# Patient Record
Sex: Female | Born: 1985 | Race: White | Hispanic: No | Marital: Married | Smoking: Never smoker
Health system: Southern US, Community
[De-identification: ages and names within clinical notes are randomized; demographics above are authoritative.]

## PROBLEM LIST (undated history)

## (undated) ENCOUNTER — Inpatient Hospital Stay (HOSPITAL_COMMUNITY): Payer: Self-pay

## (undated) DIAGNOSIS — Z8619 Personal history of other infectious and parasitic diseases: Secondary | ICD-10-CM

## (undated) HISTORY — DX: Personal history of other infectious and parasitic diseases: Z86.19

---

## 2011-12-05 LAB — OB RESULTS CONSOLE GC/CHLAMYDIA: Chlamydia: NEGATIVE

## 2011-12-05 LAB — OB RESULTS CONSOLE RPR: RPR: NONREACTIVE

## 2011-12-05 LAB — OB RESULTS CONSOLE HEPATITIS B SURFACE ANTIGEN: Hepatitis B Surface Ag: NEGATIVE

## 2011-12-11 NOTE — L&D Delivery Note (Signed)
Delivery Note At 9:48 PM a viable and healthy female was delivered via Vaginal, Vacuum (Extractor) (Presentation: Left Occiput Anterior).  APGAR: 7, 8; weight 7 lb 13.2 oz (3549 g).  Moderate shoulder dystocia resolved with delivery of posterior (left) shoulder and arm. Placenta status: Intact, Spontaneous.  Cord: 3 vessels with the following complications: .  Cord pH: 7.22  Anesthesia: Epidural  Episiotomy: None Lacerations: 3rd degree perineal laceration Suture Repair: 3.0 chromic Est. Blood Loss (mL): 500  Mom to postpartum.  Baby to nursery-stable.  Kristin Le D 07/11/2012, 10:51 PM

## 2012-04-30 ENCOUNTER — Encounter: Payer: BC Managed Care – PPO | Attending: Obstetrics and Gynecology | Admitting: Dietician

## 2012-04-30 DIAGNOSIS — Z713 Dietary counseling and surveillance: Secondary | ICD-10-CM | POA: Insufficient documentation

## 2012-04-30 DIAGNOSIS — O99810 Abnormal glucose complicating pregnancy: Secondary | ICD-10-CM | POA: Insufficient documentation

## 2012-05-01 ENCOUNTER — Encounter: Payer: Self-pay | Admitting: Dietician

## 2012-05-01 NOTE — Progress Notes (Signed)
  Patient was seen on 04/30/2012 for Gestational Diabetes self-management class at the [facility]. The following learning objectives were met by the patient during this course:   States the definition of Gestational Diabetes  States why dietary management is important in controlling blood glucose  Describes the effects each nutrient has on blood glucose levels  Demonstrates ability to create a balanced meal plan  Demonstrates carbohydrate counting   States when to check blood glucose levels  Demonstrates proper blood glucose monitoring techniques  States the effect of stress and exercise on blood glucose levels  States the importance of limiting caffeine and abstaining from alcohol and smoking  Blood glucose monitor given: One Touch Ultra Mini Meter Kit Lot # [id number] X Exp: 10/2012 Blood glucose reading: 91 mg at 11:10 AM  Patient instructed to monitor glucose levels:Fasting and 2 hours after first bite of each meal FBS: 60 - <90 2 hour: <120  Patient received handouts:  Nutrition Diabetes and Pregnancy  Carbohydrate Counting List  Patient will be seen for follow-up as needed.

## 2012-07-08 ENCOUNTER — Telehealth (HOSPITAL_COMMUNITY): Payer: Self-pay | Admitting: *Deleted

## 2012-07-08 ENCOUNTER — Encounter (HOSPITAL_COMMUNITY): Payer: Self-pay | Admitting: *Deleted

## 2012-07-08 NOTE — Telephone Encounter (Signed)
Preadmission screen  

## 2012-07-10 ENCOUNTER — Inpatient Hospital Stay (HOSPITAL_COMMUNITY): Admission: RE | Admit: 2012-07-10 | Payer: BC Managed Care – PPO | Source: Ambulatory Visit

## 2012-07-10 ENCOUNTER — Encounter (HOSPITAL_COMMUNITY): Payer: Self-pay

## 2012-07-10 ENCOUNTER — Inpatient Hospital Stay (HOSPITAL_COMMUNITY)
Admission: RE | Admit: 2012-07-10 | Discharge: 2012-07-13 | DRG: 372 | Disposition: A | Discharge status: Home / Self Care | Payer: BC Managed Care – PPO | Source: Ambulatory Visit | Attending: Obstetrics and Gynecology | Admitting: Obstetrics and Gynecology

## 2012-07-10 DIAGNOSIS — Z3400 Encounter for supervision of normal first pregnancy, unspecified trimester: Secondary | ICD-10-CM

## 2012-07-10 DIAGNOSIS — O99814 Abnormal glucose complicating childbirth: Secondary | ICD-10-CM | POA: Diagnosis present

## 2012-07-10 LAB — CBC
HCT: 37 % (ref 36.0–46.0)
MCH: 32.2 pg (ref 26.0–34.0)
MCHC: 33.8 g/dL (ref 30.0–36.0)
MCV: 95.4 fL (ref 78.0–100.0)
RDW: 14.7 % (ref 11.5–15.5)

## 2012-07-10 MED ORDER — OXYCODONE-ACETAMINOPHEN 5-325 MG PO TABS
1.0000 | ORAL_TABLET | ORAL | Status: DC | PRN
  Filled 2012-07-10: qty 2

## 2012-07-10 MED ORDER — OXYTOCIN BOLUS FROM INFUSION
250.0000 mL | Freq: Once | INTRAVENOUS | Status: DC
  Filled 2012-07-10: qty 500

## 2012-07-10 MED ORDER — MISOPROSTOL 25 MCG QUARTER TABLET
25.0000 ug | ORAL_TABLET | ORAL | Status: DC | PRN
  Administered 2012-07-10 – 2012-07-11 (×2): 25 ug via VAGINAL
  Filled 2012-07-10 (×2): qty 0.25

## 2012-07-10 MED ORDER — IBUPROFEN 600 MG PO TABS: 600.0000 mg | ORAL_TABLET | Freq: Four times a day (QID) | ORAL | Status: DC | PRN

## 2012-07-10 MED ORDER — ACETAMINOPHEN 325 MG PO TABS
650.0000 mg | ORAL_TABLET | ORAL | Status: DC | PRN
  Administered 2012-07-11: 650 mg via ORAL
  Filled 2012-07-10: qty 2

## 2012-07-10 MED ORDER — ONDANSETRON HCL 4 MG/2ML IJ SOLN: 4.0000 mg | Freq: Four times a day (QID) | INTRAMUSCULAR | Status: DC | PRN

## 2012-07-10 MED ORDER — ZOLPIDEM TARTRATE 5 MG PO TABS: 5.0000 mg | ORAL_TABLET | Freq: Every evening | ORAL | Status: DC | PRN

## 2012-07-10 MED ORDER — TERBUTALINE SULFATE 1 MG/ML IJ SOLN
0.2500 mg | Freq: Once | INTRAMUSCULAR | Status: AC | PRN
  Filled 2012-07-10: qty 1

## 2012-07-10 MED ORDER — LACTATED RINGERS IV SOLN
500.0000 mL | INTRAVENOUS | Status: DC | PRN
  Administered 2012-07-11: 500 mL via INTRAVENOUS

## 2012-07-10 MED ORDER — FLEET ENEMA 7-19 GM/118ML RE ENEM: 1.0000 | ENEMA | RECTAL | Status: DC | PRN

## 2012-07-10 MED ORDER — OXYTOCIN 40 UNITS IN LACTATED RINGERS INFUSION - SIMPLE MED: 62.5000 mL/h | Freq: Once | INTRAVENOUS | Status: DC

## 2012-07-10 MED ORDER — LACTATED RINGERS IV SOLN
INTRAVENOUS | Status: DC
  Administered 2012-07-10 – 2012-07-11 (×4): via INTRAVENOUS

## 2012-07-10 MED ORDER — LIDOCAINE HCL (PF) 1 % IJ SOLN
30.0000 mL | INTRAMUSCULAR | Status: DC | PRN
  Administered 2012-07-11: 30 mL via SUBCUTANEOUS
  Filled 2012-07-10: qty 30

## 2012-07-10 MED ORDER — CITRIC ACID-SODIUM CITRATE 334-500 MG/5ML PO SOLN: 30.0000 mL | ORAL | Status: DC | PRN

## 2012-07-10 NOTE — H&P (Signed)
26 y.o. G1P0  Estimated Date of Delivery: 07/11/12 admitted at [redacted] weeks gestation for induction.  Prenatal Transfer Tool  Maternal Diabetes: Gestational DM well controlled on Glyburide. Genetic Screening: Normal Maternal Ultrasounds/Referrals: Normal Fetal Ultrasounds or other Referrals:  None Maternal Substance Abuse:  No Significant Maternal Medications:  Glyburide 5 mg in AM and 2.5 mg in PM Significant Maternal Lab Results: Abnormal 3 hour GTT Other Significant Pregnancy Complications:  None  Afebrile, VSS Heart and Lungs: No active disease Abdomen: soft, gravid, EFW AGA. Cervical exam:  Closed  Impression: Well controlled Gestational DM on Glyburide.  Plan:  Induction.

## 2012-07-11 ENCOUNTER — Encounter (HOSPITAL_COMMUNITY): Payer: Self-pay

## 2012-07-11 ENCOUNTER — Inpatient Hospital Stay (HOSPITAL_COMMUNITY): Payer: BC Managed Care – PPO | Admitting: Anesthesiology

## 2012-07-11 ENCOUNTER — Encounter (HOSPITAL_COMMUNITY): Payer: Self-pay | Admitting: Anesthesiology

## 2012-07-11 MED ORDER — TERBUTALINE SULFATE 1 MG/ML IJ SOLN
0.2500 mg | Freq: Once | INTRAMUSCULAR | Status: AC
  Administered 2012-07-11: 0.25 mg via SUBCUTANEOUS

## 2012-07-11 MED ORDER — OXYTOCIN 40 UNITS IN LACTATED RINGERS INFUSION - SIMPLE MED
1.0000 m[IU]/min | INTRAVENOUS | Status: DC
  Filled 2012-07-11: qty 1000

## 2012-07-11 MED ORDER — EPHEDRINE 5 MG/ML INJ: 10.0000 mg | INTRAVENOUS | Status: DC | PRN

## 2012-07-11 MED ORDER — NALBUPHINE SYRINGE 5 MG/0.5 ML
5.0000 mg | INJECTION | INTRAMUSCULAR | Status: DC | PRN
  Administered 2012-07-11: 5 mg via INTRAVENOUS
  Filled 2012-07-11 (×2): qty 0.5

## 2012-07-11 MED ORDER — FENTANYL 2.5 MCG/ML BUPIVACAINE 1/10 % EPIDURAL INFUSION (WH - ANES)
INTRAMUSCULAR | Status: DC | PRN
  Administered 2012-07-11: 14 mL/h via EPIDURAL

## 2012-07-11 MED ORDER — MISOPROSTOL 200 MCG PO TABS
ORAL_TABLET | ORAL | Status: AC
  Filled 2012-07-11: qty 4

## 2012-07-11 MED ORDER — SODIUM BICARBONATE 8.4 % IV SOLN
INTRAVENOUS | Status: DC | PRN
  Administered 2012-07-11: 4 mL via EPIDURAL

## 2012-07-11 MED ORDER — EPHEDRINE 5 MG/ML INJ
10.0000 mg | INTRAVENOUS | Status: DC | PRN
  Filled 2012-07-11 (×3): qty 4

## 2012-07-11 MED ORDER — OXYCODONE-ACETAMINOPHEN 5-325 MG PO TABS
2.0000 | ORAL_TABLET | ORAL | Status: DC | PRN
  Administered 2012-07-11: 2 via ORAL
  Administered 2012-07-12 – 2012-07-13 (×3): 1 via ORAL
  Filled 2012-07-11: qty 2
  Filled 2012-07-11 (×3): qty 1

## 2012-07-11 MED ORDER — TERBUTALINE SULFATE 1 MG/ML IJ SOLN: 0.2500 mg | Freq: Once | INTRAMUSCULAR | Status: AC | PRN

## 2012-07-11 MED ORDER — OXYTOCIN 40 UNITS IN LACTATED RINGERS INFUSION - SIMPLE MED: 1.0000 m[IU]/min | INTRAVENOUS | Status: DC

## 2012-07-11 MED ORDER — FENTANYL 2.5 MCG/ML BUPIVACAINE 1/10 % EPIDURAL INFUSION (WH - ANES)
14.0000 mL/h | INTRAMUSCULAR | Status: DC
  Administered 2012-07-11 (×6): 14 mL/h via EPIDURAL
  Filled 2012-07-11 (×7): qty 60

## 2012-07-11 MED ORDER — PHENYLEPHRINE 40 MCG/ML (10ML) SYRINGE FOR IV PUSH (FOR BLOOD PRESSURE SUPPORT): 80.0000 ug | PREFILLED_SYRINGE | INTRAVENOUS | Status: DC | PRN

## 2012-07-11 MED ORDER — OXYTOCIN 40 UNITS IN LACTATED RINGERS INFUSION - SIMPLE MED
1.0000 m[IU]/min | INTRAVENOUS | Status: DC
  Administered 2012-07-11: 2 m[IU]/min via INTRAVENOUS

## 2012-07-11 MED ORDER — PHENYLEPHRINE 40 MCG/ML (10ML) SYRINGE FOR IV PUSH (FOR BLOOD PRESSURE SUPPORT)
80.0000 ug | PREFILLED_SYRINGE | INTRAVENOUS | Status: DC | PRN
  Filled 2012-07-11 (×4): qty 5

## 2012-07-11 MED ORDER — DIPHENHYDRAMINE HCL 50 MG/ML IJ SOLN: 12.5000 mg | INTRAMUSCULAR | Status: DC | PRN

## 2012-07-11 MED ORDER — LACTATED RINGERS IV SOLN
500.0000 mL | Freq: Once | INTRAVENOUS | Status: AC
  Administered 2012-07-11: 500 mL via INTRAVENOUS

## 2012-07-11 MED ORDER — OXYCODONE-ACETAMINOPHEN 5-325 MG PO TABS: 1.0000 | ORAL_TABLET | ORAL | Status: DC | PRN

## 2012-07-11 MED ORDER — BUPIVACAINE HCL (PF) 0.25 % IJ SOLN
INTRAMUSCULAR | Status: DC | PRN
  Administered 2012-07-11 (×9): 5 mL

## 2012-07-11 NOTE — Anesthesia Procedure Notes (Addendum)
Epidural Patient location during procedure: OB  Preanesthetic Checklist Completed: patient identified, site marked, surgical consent, pre-op evaluation, timeout performed, IV checked, risks and benefits discussed and monitors and equipment checked  Epidural Patient position: sitting Prep: site prepped and draped and DuraPrep Patient monitoring: continuous pulse ox and blood pressure Approach: midline Injection technique: LOR air  Needle:  Needle type: Tuohy  Needle gauge: 17 G Needle length: 9 cm Needle insertion depth: 5 cm cm Catheter type: closed end flexible Catheter size: 19 Gauge Catheter at skin depth: 10 cm Test dose: negative  Assessment Events: blood not aspirated, injection not painful, no injection resistance, negative IV test and no paresthesia  Additional Notes Dosing of Epidural:  1st dose, through needle ............................................Marland Kitchen epi 1:200K + Xylocaine 40 mg  2nd dose, through catheter, after waiting 3 minutes...Marland KitchenMarland Kitchenepi 1:200K + Xylocaine 40 mg  3rd dose, through catheter after waiting 3 minutes .............................Marcaine   4mg    ( mg Marcaine are expressed as equivilent  cc's medication removed from the 0.1%Bupiv / fentanyl syringe from L&D pump)  ( 2% Xylo charted as a single dose in Epic Meds for ease of charting; actual dosing was fractionated as above, for saftey's sake)  As each dose occurred, patient was free of IV sx; and patient exhibited no evidence of SA injection.  Patient is more comfortable after epidural dosed. Please see RN's note for documentation of vital signs,and FHR which are stable.  Patient reminded not to try to ambulate with numb legs, and that an RN must be present the 1st time she attempts to get up.   Epidural Patient location during procedure: OB Start time: 07/11/2012 12:04 PM  Staffing Anesthesiologist: Brayton Caves R Performed by: anesthesiologist   Preanesthetic Checklist Completed:  patient identified, site marked, surgical consent, pre-op evaluation, timeout performed, IV checked, risks and benefits discussed and monitors and equipment checked  Epidural Patient position: sitting Prep: site prepped and draped and DuraPrep Patient monitoring: continuous pulse ox and blood pressure Approach: midline Injection technique: LOR air and LOR saline  Needle:  Needle type: Tuohy  Needle gauge: 17 G Needle length: 9 cm Needle insertion depth: 5 cm cm Catheter type: closed end flexible Catheter size: 19 Gauge Catheter at skin depth: 10 cm Test dose: negative  Assessment Events: blood not aspirated, injection not painful, no injection resistance, negative IV test and no paresthesia  Additional Notes Patient identified.  Risk benefits discussed including failed block, incomplete pain control, headache, nerve damage, paralysis, blood pressure changes, nausea, vomiting, reactions to medication both toxic or allergic, and postpartum back pain.  Patient expressed understanding and wished to proceed.  All questions were answered.  Sterile technique used throughout procedure and epidural site dressed with sterile barrier dressing. No paresthesia or other complications noted.The patient did not experience any signs of intravascular injection such as tinnitus or metallic taste in mouth nor signs of intrathecal spread such as rapid motor block. Please see nursing notes for vital signs.

## 2012-07-11 NOTE — Anesthesia Preprocedure Evaluation (Signed)
Anesthesia Evaluation  Patient identified by MRN, date of birth, ID band Patient awake    Reviewed: Allergy & Precautions, H&P , Patient's Chart, lab work & pertinent test results  Airway Mallampati: II TM Distance: >3 FB Neck ROM: full    Dental  (+) Teeth Intact   Pulmonary  breath sounds clear to auscultation        Cardiovascular Rhythm:regular Rate:Normal     Neuro/Psych    GI/Hepatic   Endo/Other  Gestational  Renal/GU      Musculoskeletal   Abdominal   Peds  Hematology   Anesthesia Other Findings       Reproductive/Obstetrics (+) Pregnancy                           Anesthesia Physical Anesthesia Plan  ASA: II  Anesthesia Plan: Epidural   Post-op Pain Management:    Induction:   Airway Management Planned:   Additional Equipment:   Intra-op Plan:   Post-operative Plan:   Informed Consent: I have reviewed the patients History and Physical, chart, labs and discussed the procedure including the risks, benefits and alternatives for the proposed anesthesia with the patient or authorized representative who has indicated his/her understanding and acceptance.   Dental Advisory Given  Plan Discussed with:   Anesthesia Plan Comments: (Labs checked- platelets confirmed with RN in room. Fetal heart tracing, per RN, reported to be stable enough for sitting procedure. Discussed epidural, and patient consents to the procedure:  included risk of possible headache,backache, failed block, allergic reaction, and nerve injury. This patient was asked if she had any questions or concerns before the procedure started. )        Anesthesia Quick Evaluation

## 2012-07-12 ENCOUNTER — Encounter (HOSPITAL_COMMUNITY): Payer: Self-pay

## 2012-07-12 LAB — CBC
Hemoglobin: 10.4 g/dL — ABNORMAL LOW (ref 12.0–15.0)
MCH: 32 pg (ref 26.0–34.0)
Platelets: 135 10*3/uL — ABNORMAL LOW (ref 150–400)
RBC: 3.25 MIL/uL — ABNORMAL LOW (ref 3.87–5.11)
WBC: 19.9 10*3/uL — ABNORMAL HIGH (ref 4.0–10.5)

## 2012-07-12 MED ORDER — TETANUS-DIPHTH-ACELL PERTUSSIS 5-2.5-18.5 LF-MCG/0.5 IM SUSP: 0.5000 mL | Freq: Once | INTRAMUSCULAR | Status: DC

## 2012-07-12 MED ORDER — DIPHENHYDRAMINE HCL 25 MG PO CAPS: 25.0000 mg | ORAL_CAPSULE | Freq: Four times a day (QID) | ORAL | Status: DC | PRN

## 2012-07-12 MED ORDER — ZOLPIDEM TARTRATE 5 MG PO TABS: 5.0000 mg | ORAL_TABLET | Freq: Every evening | ORAL | Status: DC | PRN

## 2012-07-12 MED ORDER — SENNOSIDES-DOCUSATE SODIUM 8.6-50 MG PO TABS
2.0000 | ORAL_TABLET | Freq: Every day | ORAL | Status: DC
  Administered 2012-07-13: 2 via ORAL

## 2012-07-12 MED ORDER — DIBUCAINE 1 % RE OINT
1.0000 "application " | TOPICAL_OINTMENT | RECTAL | Status: DC | PRN
  Administered 2012-07-12: 1 via RECTAL
  Filled 2012-07-12: qty 28

## 2012-07-12 MED ORDER — WITCH HAZEL-GLYCERIN EX PADS
1.0000 "application " | MEDICATED_PAD | CUTANEOUS | Status: DC | PRN
  Administered 2012-07-12: 1 via TOPICAL

## 2012-07-12 MED ORDER — ONDANSETRON HCL 4 MG/2ML IJ SOLN: 4.0000 mg | INTRAMUSCULAR | Status: DC | PRN

## 2012-07-12 MED ORDER — BENZOCAINE-MENTHOL 20-0.5 % EX AERO
1.0000 "application " | INHALATION_SPRAY | CUTANEOUS | Status: DC | PRN
  Administered 2012-07-12: 1 via TOPICAL
  Filled 2012-07-12: qty 56

## 2012-07-12 MED ORDER — IBUPROFEN 600 MG PO TABS
600.0000 mg | ORAL_TABLET | Freq: Four times a day (QID) | ORAL | Status: DC
  Administered 2012-07-12 – 2012-07-13 (×7): 600 mg via ORAL
  Filled 2012-07-12 (×7): qty 1

## 2012-07-12 MED ORDER — PRENATAL MULTIVITAMIN CH
1.0000 | ORAL_TABLET | Freq: Every day | ORAL | Status: DC
  Administered 2012-07-12 – 2012-07-13 (×2): 1 via ORAL
  Filled 2012-07-12 (×2): qty 1

## 2012-07-12 MED ORDER — ONDANSETRON HCL 4 MG PO TABS: 4.0000 mg | ORAL_TABLET | ORAL | Status: DC | PRN

## 2012-07-12 MED ORDER — SIMETHICONE 80 MG PO CHEW: 80.0000 mg | CHEWABLE_TABLET | ORAL | Status: DC | PRN

## 2012-07-12 MED ORDER — LANOLIN HYDROUS EX OINT: TOPICAL_OINTMENT | CUTANEOUS | Status: DC | PRN

## 2012-07-12 NOTE — Progress Notes (Signed)
Post Partum Day 1 Subjective: no complaints  Objective: Blood pressure 97/62, pulse 93, temperature 98.3 F (36.8 C), breastfeeding.  Physical Exam:  General: alert Lochia: appropriate Uterine Fundus: firm   Basename 07/12/12 0515 07/10/12 2027  HGB 10.4* 12.5  HCT 30.6* 37.0    Assessment/Plan: Plan for discharge tomorrow   LOS: 2 days   Kristin Le 07/12/2012, 8:43 AM

## 2012-07-12 NOTE — Progress Notes (Signed)
Called Dr. Arlyce Dice because patient is extremely unhappy with her progress in pushing, even though progress is good, baby at +2/+3 station. Pt crying, saying she cannot take this anymore, can't stand the pressure in her bottom, denies painful contractions but a bit out of control. Husband saying we must do something to deliver baby. Told them I will relay their concerns to Dr. Arlyce Dice. Per Dr. Arlyce Dice he will come in and see if he can assist patient.

## 2012-07-12 NOTE — Progress Notes (Signed)
Husband continues to be upset about patients pain during this experience. Complained that patient "felt every stitch" during repair.

## 2012-07-12 NOTE — Progress Notes (Signed)
Dr. Arlyce Dice at bedside, SVE, explained to patient that he can assist her pushing with vacuum to deliver baby. Patient agreed that would be best. IUPC removed, foley cath. Removed. Patient set up for delivery. Husband at bedside.

## 2012-07-12 NOTE — Progress Notes (Signed)
Massaged patients uterus, expressing urine uterus to right side but firm. Patient to bathroom via [place], unable to void. Pericare. Returned to bed, unable to visualize urethra. Husband upset about patients discomfort during cath attempt. Called for assistance, Dr. Arlyce Dice to bedside and he placed foley due to excessive swelling. Instructed patient to use ice packs, take motrin to decrease swelling. Patient verbalized understanding.

## 2012-07-12 NOTE — Progress Notes (Signed)
Husband wants nurse to be at bedside continuously,displays a helpless attitude in performing routine activities like sitting on the sofa, placing baby in crib. Does not want wife to be alone for even one minute.

## 2012-07-12 NOTE — Progress Notes (Signed)
Dr. Kaplan at bedside, SVE, explained to patient that he can assist her pushing with vacuum to deliver baby. Patient agreed that would be best. IUPC removed, foley cath. Removed. Patient set up for delivery. Husband at bedside.  

## 2012-07-12 NOTE — Progress Notes (Signed)
This note is intended for 2125.

## 2012-07-13 MED ORDER — ACETAMINOPHEN-CODEINE #3 300-30 MG PO TABS: 1.0000 | ORAL_TABLET | ORAL | Status: AC | PRN

## 2012-07-13 NOTE — Discharge Summary (Signed)
Obstetric Discharge Summary Reason for Admission: induction of labor, GDM on glyburide at term Prenatal Procedures: ultrasound and management of GDM Intrapartum Procedures: vacuum Postpartum Procedures: none Complications-Operative and Postpartum: Third degree perineal laceration Hemoglobin  Date Value Range Status  07/12/2012 10.4* 12.0 - 15.0 g/dL Final     REPEATED TO VERIFY     DELTA CHECK NOTED     HCT  Date Value Range Status  07/12/2012 30.6* 36.0 - 46.0 % Final    Physical Exam:  General: alert Lochia: appropriate Uterine Fundus: firm  Discharge Diagnoses: Term Pregnancy-delivered and GDM on Glyburide  Discharge Information: Date: 07/13/2012 Activity: pelvic rest Diet: routine Medications: PNV, Tylenol #3 and Ibuprofen Condition: stable Instructions: refer to practice specific booklet Discharge to: home Follow-up Information    Follow up with Mickel Baas, MD.   Contact information:   [address] [place] [address] [phone]          Newborn Data: Live born female  Birth Weight: 7 lb 13.2 oz (3549 g) APGAR: 7, 8  Home with mother.  Efton Thomley D 07/13/2012, 9:20 AM

## 2012-08-29 ENCOUNTER — Inpatient Hospital Stay (HOSPITAL_COMMUNITY): Admission: AD | Admit: 2012-08-29 | Payer: Self-pay | Source: Ambulatory Visit | Admitting: Obstetrics & Gynecology

## 2013-07-21 ENCOUNTER — Inpatient Hospital Stay (HOSPITAL_COMMUNITY)
Admission: AD | Admit: 2013-07-21 | Discharge: 2013-07-21 | Disposition: A | Discharge status: Home / Self Care | Payer: Managed Care, Other (non HMO) | Source: Ambulatory Visit | Attending: Obstetrics and Gynecology | Admitting: Obstetrics and Gynecology

## 2013-07-21 ENCOUNTER — Encounter (HOSPITAL_COMMUNITY): Payer: Self-pay | Admitting: *Deleted

## 2013-07-21 DIAGNOSIS — E860 Dehydration: Secondary | ICD-10-CM | POA: Insufficient documentation

## 2013-07-21 DIAGNOSIS — O219 Vomiting of pregnancy, unspecified: Secondary | ICD-10-CM

## 2013-07-21 DIAGNOSIS — O210 Mild hyperemesis gravidarum: Secondary | ICD-10-CM

## 2013-07-21 DIAGNOSIS — O211 Hyperemesis gravidarum with metabolic disturbance: Secondary | ICD-10-CM | POA: Insufficient documentation

## 2013-07-21 LAB — URINALYSIS, ROUTINE W REFLEX MICROSCOPIC
Glucose, UA: NEGATIVE mg/dL
Leukocytes, UA: NEGATIVE
Nitrite: NEGATIVE
Specific Gravity, Urine: 1.01 (ref 1.005–1.030)
pH: 8.5 — ABNORMAL HIGH (ref 5.0–8.0)

## 2013-07-21 LAB — URINE MICROSCOPIC-ADD ON

## 2013-07-21 MED ORDER — LACTATED RINGERS IV BOLUS (SEPSIS)
1000.0000 mL | Freq: Once | INTRAVENOUS | Status: AC
  Administered 2013-07-21: 1000 mL via INTRAVENOUS

## 2013-07-21 MED ORDER — ONDANSETRON HCL 4 MG/2ML IJ SOLN
4.0000 mg | Freq: Once | INTRAMUSCULAR | Status: AC
  Administered 2013-07-21: 4 mg via INTRAVENOUS
  Filled 2013-07-21: qty 2

## 2013-07-21 MED ORDER — ONDANSETRON HCL 4 MG PO TABS: 4.0000 mg | ORAL_TABLET | Freq: Four times a day (QID) | ORAL | Status: DC

## 2013-07-21 MED ORDER — PROMETHAZINE HCL 25 MG PO TABS: 25.0000 mg | ORAL_TABLET | Freq: Four times a day (QID) | ORAL | Status: DC | PRN

## 2013-07-21 NOTE — MAU Note (Signed)
Vomitting since yesterday, no diarrhea.

## 2013-07-21 NOTE — MAU Provider Note (Signed)
History     CSN: [id number]  Arrival date and time: 07/21/13 1003   First Provider Initiated Contact with Patient 07/21/13 1032      Chief Complaint  Patient presents with  . Emesis During Pregnancy  . Extremity Weakness   HPI [name] is a 27 y.o. G2P1001 at [redacted]w[redacted]d who presents to MAU today from the office with N/V since yesterday. The patient denies abdominal pain, diarrhea, constipation, fever, vaginal bleeding, discharge or UTI symptoms. She does have fatigue and weakness, but denies dizziness or LOC. LMP was 06/11/13 but patient states that this was lighter than a normal period for her.    OB History   Grav Para Term Preterm Abortions TAB SAB Ect Mult Living   2 1 1  0 0 0 0 0 0 1      Past Medical History  Diagnosis Date  . H/O varicella   . Gestational diabetes     glyburide    Past Surgical History  Procedure Laterality Date  . No past surgeries      Family History  Problem Relation Age of Onset  . Heart disease Maternal Grandfather     History  Substance Use Topics  . Smoking status: Never Smoker   . Smokeless tobacco: Never Used  . Alcohol Use: No    Allergies: No Known Allergies  No prescriptions prior to admission    Review of Systems  Constitutional: Positive for malaise/fatigue. Negative for fever.  Gastrointestinal: Positive for nausea and vomiting. Negative for abdominal pain, diarrhea and constipation.  Genitourinary: Negative for dysuria, urgency and frequency.       Neg - vaginal bleeding, discharge  Neurological: Positive for weakness and headaches. Negative for dizziness and loss of consciousness.   Physical Exam   Blood pressure 96/57, pulse 83, temperature 98.8 F (37.1 C), temperature source Oral, resp. rate 18, height 5\' 1"  (1.549 m), weight 104 lb 9.6 oz (47.446 kg), last menstrual period 06/11/2013.  Physical Exam  Constitutional: She is oriented to person, place, and time. She appears well-developed and  well-nourished. No distress.  HENT:  Head: Normocephalic and atraumatic.  Cardiovascular: Normal rate, regular rhythm and normal heart sounds.   Respiratory: Effort normal and breath sounds normal. No respiratory distress.  GI: Soft. Bowel sounds are normal. She exhibits no distension and no mass. There is no tenderness. There is no rebound and no guarding.  Neurological: She is alert and oriented to person, place, and time.  Skin: Skin is warm and dry. No erythema.  Psychiatric: She has a normal mood and affect.   Results for orders placed during the hospital encounter of 07/21/13 (from the past 24 hour(s))  URINALYSIS, ROUTINE W REFLEX MICROSCOPIC     Status: Abnormal   Collection Time    07/21/13 10:44 AM      Result Value Range   Color, Urine YELLOW  YELLOW   APPearance CLEAR  CLEAR   Specific Gravity, Urine 1.010  1.005 - 1.030   pH 8.5 (*) 5.0 - 8.0   Glucose, UA NEGATIVE  NEGATIVE mg/dL   Hgb urine dipstick NEGATIVE  NEGATIVE   Bilirubin Urine NEGATIVE  NEGATIVE   Ketones, ur NEGATIVE  NEGATIVE mg/dL   Protein, ur 30 (*) NEGATIVE mg/dL   Urobilinogen, UA 1.0  0.0 - 1.0 mg/dL   Nitrite NEGATIVE  NEGATIVE   Leukocytes, UA NEGATIVE  NEGATIVE  URINE MICROSCOPIC-ADD ON     Status: None   Collection Time    07/21/13  10:44 AM      Result Value Range   Squamous Epithelial / LPF RARE  RARE   WBC, UA 0-2  <3 WBC/hpf   Bacteria, UA RARE  RARE    MAU Course  Procedures None  MDM Discussed with Dr. Claiborne Billings. States patient had ketones in her urine in the office. Give IV fluids and IV Zofran. Send home with instructions to take Vitamin B6 and Unisom and Rx for Zofran and Phenergan suppositories PRN.  Patient given 1 liter IV LR with 4 mg IV Zofran - patient able to tolerate PO  Assessment and Plan  A: Nausea and vomiting in pregnancy prior to [redacted] weeks gestation  P: Discharge home Rx for Phenergan and Zofran sent to patient's pharmacy Advised patient to try Vitamin B6 and  Unisom OTC for N/V as first line therapy Patient encouraged to keep scheduled appointments for routine prenatal care with [facility] Patient may return to MAU as needed or if her condition were to change or worsen  Freddi Starr, PA-C  07/21/2013, 1:30 PM

## 2013-07-21 NOTE — MAU Note (Addendum)
Sent from dr's office.  Checked her urine and she was dehydrated. Assisted to rm via wc, due to weakness. Unable to get urine here.

## 2013-08-11 LAB — OB RESULTS CONSOLE ABO/RH: RH TYPE: POSITIVE

## 2013-08-11 LAB — OB RESULTS CONSOLE ANTIBODY SCREEN: Antibody Screen: NEGATIVE

## 2013-08-11 LAB — OB RESULTS CONSOLE RUBELLA ANTIBODY, IGM: RUBELLA: IMMUNE

## 2013-08-11 LAB — OB RESULTS CONSOLE HEPATITIS B SURFACE ANTIGEN: Hepatitis B Surface Ag: NEGATIVE

## 2013-08-11 LAB — OB RESULTS CONSOLE RPR: RPR: NONREACTIVE

## 2013-08-11 LAB — OB RESULTS CONSOLE HIV ANTIBODY (ROUTINE TESTING): HIV: NONREACTIVE

## 2014-01-26 ENCOUNTER — Other Ambulatory Visit (HOSPITAL_COMMUNITY): Payer: Self-pay | Admitting: Obstetrics and Gynecology

## 2014-01-26 DIAGNOSIS — O24419 Gestational diabetes mellitus in pregnancy, unspecified control: Secondary | ICD-10-CM

## 2014-01-28 ENCOUNTER — Ambulatory Visit (HOSPITAL_COMMUNITY): Payer: Managed Care, Other (non HMO)

## 2014-02-01 ENCOUNTER — Telehealth (HOSPITAL_COMMUNITY): Payer: Self-pay | Admitting: *Deleted

## 2014-02-01 ENCOUNTER — Encounter (HOSPITAL_COMMUNITY): Payer: Self-pay | Admitting: *Deleted

## 2014-02-01 LAB — OB RESULTS CONSOLE GBS: GBS: NEGATIVE

## 2014-02-01 NOTE — Telephone Encounter (Signed)
Preadmission screen  

## 2014-02-03 ENCOUNTER — Inpatient Hospital Stay (HOSPITAL_COMMUNITY)
Admission: RE | Admit: 2014-02-03 | Discharge: 2014-02-06 | DRG: 766 | Disposition: A | Discharge status: Home / Self Care | Payer: Managed Care, Other (non HMO) | Source: Ambulatory Visit | Attending: Obstetrics and Gynecology | Admitting: Obstetrics and Gynecology

## 2014-02-03 ENCOUNTER — Encounter (HOSPITAL_COMMUNITY): Payer: Self-pay

## 2014-02-03 VITALS — BP 98/60 | HR 69 | Temp 97.6°F | Resp 16 | Ht 60.0 in | Wt 137.0 lb

## 2014-02-03 DIAGNOSIS — O99814 Abnormal glucose complicating childbirth: Principal | ICD-10-CM | POA: Diagnosis present

## 2014-02-03 DIAGNOSIS — Z3490 Encounter for supervision of normal pregnancy, unspecified, unspecified trimester: Secondary | ICD-10-CM

## 2014-02-03 DIAGNOSIS — O324XX0 Maternal care for high head at term, not applicable or unspecified: Secondary | ICD-10-CM | POA: Diagnosis present

## 2014-02-03 DIAGNOSIS — Z9889 Other specified postprocedural states: Secondary | ICD-10-CM

## 2014-02-03 DIAGNOSIS — Z794 Long term (current) use of insulin: Secondary | ICD-10-CM

## 2014-02-03 LAB — GLUCOSE, CAPILLARY: Glucose-Capillary: 58 mg/dL — ABNORMAL LOW (ref 70–99)

## 2014-02-03 LAB — CBC
HCT: 36.3 % (ref 36.0–46.0)
Hemoglobin: 12.3 g/dL (ref 12.0–15.0)
MCH: 31.1 pg (ref 26.0–34.0)
MCHC: 33.9 g/dL (ref 30.0–36.0)
MCV: 91.7 fL (ref 78.0–100.0)
PLATELETS: 172 10*3/uL (ref 150–400)
RBC: 3.96 MIL/uL (ref 3.87–5.11)
RDW: 14.4 % (ref 11.5–15.5)
WBC: 11.9 10*3/uL — ABNORMAL HIGH (ref 4.0–10.5)

## 2014-02-03 MED ORDER — LACTATED RINGERS IV SOLN
INTRAVENOUS | Status: DC
  Administered 2014-02-03 – 2014-02-04 (×6): via INTRAVENOUS

## 2014-02-03 MED ORDER — LACTATED RINGERS IV SOLN
500.0000 mL | INTRAVENOUS | Status: DC | PRN
  Administered 2014-02-04: 300 mL via INTRAVENOUS

## 2014-02-03 MED ORDER — ONDANSETRON HCL 4 MG/2ML IJ SOLN: 4.0000 mg | Freq: Four times a day (QID) | INTRAMUSCULAR | Status: DC | PRN

## 2014-02-03 MED ORDER — OXYCODONE-ACETAMINOPHEN 5-325 MG PO TABS: 1.0000 | ORAL_TABLET | ORAL | Status: DC | PRN

## 2014-02-03 MED ORDER — BUTORPHANOL TARTRATE 1 MG/ML IJ SOLN
1.0000 mg | INTRAMUSCULAR | Status: DC | PRN
  Administered 2014-02-04 (×2): 2 mg via INTRAVENOUS
  Filled 2014-02-03 (×3): qty 2

## 2014-02-03 MED ORDER — IBUPROFEN 600 MG PO TABS: 600.0000 mg | ORAL_TABLET | Freq: Four times a day (QID) | ORAL | Status: DC | PRN

## 2014-02-03 MED ORDER — OXYTOCIN 40 UNITS IN LACTATED RINGERS INFUSION - SIMPLE MED
62.5000 mL/h | INTRAVENOUS | Status: DC
  Filled 2014-02-03: qty 1000

## 2014-02-03 MED ORDER — CITRIC ACID-SODIUM CITRATE 334-500 MG/5ML PO SOLN
30.0000 mL | ORAL | Status: DC | PRN
  Administered 2014-02-04: 30 mL via ORAL
  Filled 2014-02-03: qty 15

## 2014-02-03 MED ORDER — LIDOCAINE HCL (PF) 1 % IJ SOLN
30.0000 mL | INTRAMUSCULAR | Status: DC | PRN
  Filled 2014-02-03: qty 30

## 2014-02-03 MED ORDER — MISOPROSTOL 25 MCG QUARTER TABLET
25.0000 ug | ORAL_TABLET | ORAL | Status: DC | PRN
  Administered 2014-02-03: 25 ug via VAGINAL
  Filled 2014-02-03: qty 0.25

## 2014-02-03 MED ORDER — ZOLPIDEM TARTRATE 5 MG PO TABS: 5.0000 mg | ORAL_TABLET | Freq: Every evening | ORAL | Status: DC | PRN

## 2014-02-03 MED ORDER — TERBUTALINE SULFATE 1 MG/ML IJ SOLN: 0.2500 mg | Freq: Once | INTRAMUSCULAR | Status: AC | PRN

## 2014-02-03 MED ORDER — OXYTOCIN BOLUS FROM INFUSION: 500.0000 mL | INTRAVENOUS | Status: DC

## 2014-02-03 MED ORDER — ACETAMINOPHEN 325 MG PO TABS
650.0000 mg | ORAL_TABLET | ORAL | Status: DC | PRN
  Administered 2014-02-04: 650 mg via ORAL
  Filled 2014-02-03: qty 2

## 2014-02-04 ENCOUNTER — Inpatient Hospital Stay (HOSPITAL_COMMUNITY): Payer: Managed Care, Other (non HMO) | Admitting: Anesthesiology

## 2014-02-04 ENCOUNTER — Encounter (HOSPITAL_COMMUNITY): Payer: Self-pay

## 2014-02-04 ENCOUNTER — Encounter (HOSPITAL_COMMUNITY): Payer: Managed Care, Other (non HMO) | Admitting: Anesthesiology

## 2014-02-04 ENCOUNTER — Encounter (HOSPITAL_COMMUNITY)
Admission: RE | Disposition: A | Discharge status: Home / Self Care | Payer: Self-pay | Source: Ambulatory Visit | Attending: Obstetrics and Gynecology

## 2014-02-04 DIAGNOSIS — Z9889 Other specified postprocedural states: Secondary | ICD-10-CM

## 2014-02-04 LAB — GLUCOSE, CAPILLARY
GLUCOSE-CAPILLARY: 66 mg/dL — AB (ref 70–99)
GLUCOSE-CAPILLARY: 94 mg/dL (ref 70–99)
GLUCOSE-CAPILLARY: 74 mg/dL (ref 70–99)
Glucose-Capillary: 91 mg/dL (ref 70–99)
Glucose-Capillary: 90 mg/dL (ref 70–99)
Glucose-Capillary: 84 mg/dL (ref 70–99)

## 2014-02-04 LAB — RPR: RPR: NONREACTIVE

## 2014-02-04 SURGERY — Surgical Case
Anesthesia: Epidural | Site: Abdomen

## 2014-02-04 MED ORDER — ONDANSETRON HCL 4 MG/2ML IJ SOLN: 4.0000 mg | Freq: Three times a day (TID) | INTRAMUSCULAR | Status: DC | PRN

## 2014-02-04 MED ORDER — METHYLERGONOVINE MALEATE 0.2 MG/ML IJ SOLN: 0.2000 mg | INTRAMUSCULAR | Status: DC | PRN

## 2014-02-04 MED ORDER — LACTATED RINGERS IV SOLN
INTRAVENOUS | Status: DC | PRN
  Administered 2014-02-04: 19:00:00 via INTRAVENOUS

## 2014-02-04 MED ORDER — PHENYLEPHRINE 40 MCG/ML (10ML) SYRINGE FOR IV PUSH (FOR BLOOD PRESSURE SUPPORT)
PREFILLED_SYRINGE | INTRAVENOUS | Status: AC
  Filled 2014-02-04: qty 10

## 2014-02-04 MED ORDER — TETANUS-DIPHTH-ACELL PERTUSSIS 5-2.5-18.5 LF-MCG/0.5 IM SUSP
0.5000 mL | Freq: Once | INTRAMUSCULAR | Status: AC
  Administered 2014-02-06: 0.5 mL via INTRAMUSCULAR
  Filled 2014-02-04: qty 0.5

## 2014-02-04 MED ORDER — HYDROMORPHONE HCL PF 1 MG/ML IJ SOLN
INTRAMUSCULAR | Status: AC
  Filled 2014-02-04: qty 1

## 2014-02-04 MED ORDER — OXYTOCIN 10 UNIT/ML IJ SOLN
40.0000 [IU] | INTRAVENOUS | Status: DC | PRN
  Administered 2014-02-04: 40 [IU] via INTRAVENOUS

## 2014-02-04 MED ORDER — DIPHENHYDRAMINE HCL 25 MG PO CAPS: 25.0000 mg | ORAL_CAPSULE | Freq: Four times a day (QID) | ORAL | Status: DC | PRN

## 2014-02-04 MED ORDER — NALBUPHINE HCL 10 MG/ML IJ SOLN
5.0000 mg | INTRAMUSCULAR | Status: DC | PRN
  Administered 2014-02-04: 10 mg via INTRAVENOUS
  Filled 2014-02-04 (×2): qty 1

## 2014-02-04 MED ORDER — OXYTOCIN 40 UNITS IN LACTATED RINGERS INFUSION - SIMPLE MED: 62.5000 mL/h | INTRAVENOUS | Status: AC

## 2014-02-04 MED ORDER — SIMETHICONE 80 MG PO CHEW: 80.0000 mg | CHEWABLE_TABLET | ORAL | Status: DC | PRN

## 2014-02-04 MED ORDER — LACTATED RINGERS IV SOLN
INTRAVENOUS | Status: DC
  Administered 2014-02-05 (×3): via INTRAVENOUS

## 2014-02-04 MED ORDER — DIPHENHYDRAMINE HCL 50 MG/ML IJ SOLN: 12.5000 mg | INTRAMUSCULAR | Status: DC | PRN

## 2014-02-04 MED ORDER — ONDANSETRON HCL 4 MG/2ML IJ SOLN
INTRAMUSCULAR | Status: AC
  Filled 2014-02-04: qty 2

## 2014-02-04 MED ORDER — FENTANYL 2.5 MCG/ML BUPIVACAINE 1/10 % EPIDURAL INFUSION (WH - ANES)
INTRAMUSCULAR | Status: AC
  Administered 2014-02-04: 14 mL/h via EPIDURAL
  Filled 2014-02-04: qty 125

## 2014-02-04 MED ORDER — SCOPOLAMINE 1 MG/3DAYS TD PT72
MEDICATED_PATCH | TRANSDERMAL | Status: AC
Start: 2014-02-04 — End: 2014-02-05
  Filled 2014-02-04: qty 1

## 2014-02-04 MED ORDER — MORPHINE SULFATE (PF) 0.5 MG/ML IJ SOLN
INTRAMUSCULAR | Status: DC | PRN
  Administered 2014-02-04: 1 mg via INTRAVENOUS

## 2014-02-04 MED ORDER — PHENYLEPHRINE HCL 10 MG/ML IJ SOLN
INTRAMUSCULAR | Status: DC | PRN
  Administered 2014-02-04: 40 ug via INTRAVENOUS
  Administered 2014-02-04 (×3): 80 ug via INTRAVENOUS
  Administered 2014-02-04: 40 ug via INTRAVENOUS

## 2014-02-04 MED ORDER — LACTATED RINGERS IV SOLN
500.0000 mL | Freq: Once | INTRAVENOUS | Status: AC
  Administered 2014-02-04: 500 mL via INTRAVENOUS

## 2014-02-04 MED ORDER — ONDANSETRON HCL 4 MG/2ML IJ SOLN: 4.0000 mg | INTRAMUSCULAR | Status: DC | PRN

## 2014-02-04 MED ORDER — FERROUS SULFATE 325 (65 FE) MG PO TABS
325.0000 mg | ORAL_TABLET | Freq: Two times a day (BID) | ORAL | Status: DC
  Administered 2014-02-05 – 2014-02-06 (×4): 325 mg via ORAL
  Filled 2014-02-04 (×4): qty 1

## 2014-02-04 MED ORDER — SENNOSIDES-DOCUSATE SODIUM 8.6-50 MG PO TABS
2.0000 | ORAL_TABLET | ORAL | Status: DC
  Administered 2014-02-05 – 2014-02-06 (×2): 2 via ORAL
  Filled 2014-02-04 (×2): qty 2

## 2014-02-04 MED ORDER — SCOPOLAMINE 1 MG/3DAYS TD PT72
1.0000 | MEDICATED_PATCH | Freq: Once | TRANSDERMAL | Status: DC
  Administered 2014-02-04: 1.5 mg via TRANSDERMAL

## 2014-02-04 MED ORDER — MEPERIDINE HCL 25 MG/ML IJ SOLN: 6.2500 mg | INTRAMUSCULAR | Status: DC | PRN

## 2014-02-04 MED ORDER — KETOROLAC TROMETHAMINE 30 MG/ML IJ SOLN
INTRAMUSCULAR | Status: AC
  Filled 2014-02-04: qty 1

## 2014-02-04 MED ORDER — METOCLOPRAMIDE HCL 5 MG/ML IJ SOLN: 10.0000 mg | Freq: Three times a day (TID) | INTRAMUSCULAR | Status: DC | PRN

## 2014-02-04 MED ORDER — IBUPROFEN 600 MG PO TABS
600.0000 mg | ORAL_TABLET | Freq: Four times a day (QID) | ORAL | Status: DC
  Administered 2014-02-05 – 2014-02-06 (×7): 600 mg via ORAL
  Filled 2014-02-04 (×7): qty 1

## 2014-02-04 MED ORDER — DIPHENHYDRAMINE HCL 50 MG/ML IJ SOLN: 25.0000 mg | INTRAMUSCULAR | Status: DC | PRN

## 2014-02-04 MED ORDER — SIMETHICONE 80 MG PO CHEW
80.0000 mg | CHEWABLE_TABLET | Freq: Three times a day (TID) | ORAL | Status: DC
  Administered 2014-02-05 – 2014-02-06 (×5): 80 mg via ORAL
  Filled 2014-02-04 (×5): qty 1

## 2014-02-04 MED ORDER — MEASLES, MUMPS & RUBELLA VAC ~~LOC~~ INJ: 0.5000 mL | INJECTION | Freq: Once | SUBCUTANEOUS | Status: DC

## 2014-02-04 MED ORDER — ONDANSETRON HCL 4 MG/2ML IJ SOLN
INTRAMUSCULAR | Status: DC | PRN
  Administered 2014-02-04: 4 mg via INTRAVENOUS

## 2014-02-04 MED ORDER — MENTHOL 3 MG MT LOZG: 1.0000 | LOZENGE | OROMUCOSAL | Status: DC | PRN

## 2014-02-04 MED ORDER — KETOROLAC TROMETHAMINE 30 MG/ML IJ SOLN: 30.0000 mg | Freq: Four times a day (QID) | INTRAMUSCULAR | Status: AC | PRN

## 2014-02-04 MED ORDER — DIBUCAINE 1 % RE OINT: 1.0000 "application " | TOPICAL_OINTMENT | RECTAL | Status: DC | PRN

## 2014-02-04 MED ORDER — BISACODYL 10 MG RE SUPP: 10.0000 mg | Freq: Every day | RECTAL | Status: DC | PRN

## 2014-02-04 MED ORDER — HYDROMORPHONE HCL PF 1 MG/ML IJ SOLN
0.2500 mg | INTRAMUSCULAR | Status: DC | PRN
  Administered 2014-02-04 (×2): 0.5 mg via INTRAVENOUS

## 2014-02-04 MED ORDER — KETOROLAC TROMETHAMINE 30 MG/ML IJ SOLN
30.0000 mg | Freq: Four times a day (QID) | INTRAMUSCULAR | Status: AC | PRN
  Administered 2014-02-04: 30 mg via INTRAVENOUS

## 2014-02-04 MED ORDER — OXYTOCIN 40 UNITS IN LACTATED RINGERS INFUSION - SIMPLE MED
1.0000 m[IU]/min | INTRAVENOUS | Status: DC
  Administered 2014-02-04: 1 m[IU]/min via INTRAVENOUS

## 2014-02-04 MED ORDER — PRENATAL MULTIVITAMIN CH
1.0000 | ORAL_TABLET | Freq: Every day | ORAL | Status: DC
  Administered 2014-02-05 – 2014-02-06 (×2): 1 via ORAL
  Filled 2014-02-04 (×2): qty 1

## 2014-02-04 MED ORDER — TERBUTALINE SULFATE 1 MG/ML IJ SOLN: 0.2500 mg | Freq: Once | INTRAMUSCULAR | Status: DC | PRN

## 2014-02-04 MED ORDER — ZOLPIDEM TARTRATE 5 MG PO TABS: 5.0000 mg | ORAL_TABLET | Freq: Every evening | ORAL | Status: DC | PRN

## 2014-02-04 MED ORDER — PHENYLEPHRINE 40 MCG/ML (10ML) SYRINGE FOR IV PUSH (FOR BLOOD PRESSURE SUPPORT)
PREFILLED_SYRINGE | INTRAVENOUS | Status: AC
  Filled 2014-02-04: qty 5

## 2014-02-04 MED ORDER — OXYTOCIN 10 UNIT/ML IJ SOLN
INTRAMUSCULAR | Status: AC
  Filled 2014-02-04: qty 4

## 2014-02-04 MED ORDER — SODIUM CHLORIDE 0.9 % IJ SOLN: 3.0000 mL | INTRAMUSCULAR | Status: DC | PRN

## 2014-02-04 MED ORDER — FLEET ENEMA 7-19 GM/118ML RE ENEM: 1.0000 | ENEMA | Freq: Every day | RECTAL | Status: DC | PRN

## 2014-02-04 MED ORDER — SIMETHICONE 80 MG PO CHEW
80.0000 mg | CHEWABLE_TABLET | ORAL | Status: DC
  Administered 2014-02-05 – 2014-02-06 (×2): 80 mg via ORAL
  Filled 2014-02-04 (×2): qty 1

## 2014-02-04 MED ORDER — FENTANYL 2.5 MCG/ML BUPIVACAINE 1/10 % EPIDURAL INFUSION (WH - ANES)
14.0000 mL/h | INTRAMUSCULAR | Status: DC | PRN
  Administered 2014-02-04 (×3): 14 mL/h via EPIDURAL
  Filled 2014-02-04 (×2): qty 125

## 2014-02-04 MED ORDER — OXYCODONE-ACETAMINOPHEN 5-325 MG PO TABS
1.0000 | ORAL_TABLET | ORAL | Status: DC | PRN
  Administered 2014-02-05: 2 via ORAL
  Administered 2014-02-05 – 2014-02-06 (×2): 1 via ORAL
  Administered 2014-02-06 (×2): 2 via ORAL
  Administered 2014-02-06: 1 via ORAL
  Filled 2014-02-04 (×3): qty 2
  Filled 2014-02-04: qty 1
  Filled 2014-02-04 (×2): qty 2

## 2014-02-04 MED ORDER — SODIUM CHLORIDE 0.9 % IV SOLN
2.0000 g | Freq: Four times a day (QID) | INTRAVENOUS | Status: DC
  Administered 2014-02-04: 2 g via INTRAVENOUS
  Filled 2014-02-04 (×2): qty 2000

## 2014-02-04 MED ORDER — NALBUPHINE HCL 10 MG/ML IJ SOLN: 5.0000 mg | INTRAMUSCULAR | Status: DC | PRN

## 2014-02-04 MED ORDER — NALOXONE HCL 0.4 MG/ML IJ SOLN: 0.4000 mg | INTRAMUSCULAR | Status: DC | PRN

## 2014-02-04 MED ORDER — WITCH HAZEL-GLYCERIN EX PADS: 1.0000 "application " | MEDICATED_PAD | CUTANEOUS | Status: DC | PRN

## 2014-02-04 MED ORDER — PHENYLEPHRINE 40 MCG/ML (10ML) SYRINGE FOR IV PUSH (FOR BLOOD PRESSURE SUPPORT): 80.0000 ug | PREFILLED_SYRINGE | INTRAVENOUS | Status: DC | PRN

## 2014-02-04 MED ORDER — SODIUM BICARBONATE 8.4 % IV SOLN
INTRAVENOUS | Status: DC | PRN
  Administered 2014-02-04 (×4): 5 mL via EPIDURAL

## 2014-02-04 MED ORDER — METHYLERGONOVINE MALEATE 0.2 MG PO TABS: 0.2000 mg | ORAL_TABLET | ORAL | Status: DC | PRN

## 2014-02-04 MED ORDER — EPHEDRINE 5 MG/ML INJ
INTRAVENOUS | Status: AC
  Filled 2014-02-04: qty 4

## 2014-02-04 MED ORDER — MORPHINE SULFATE 0.5 MG/ML IJ SOLN
INTRAMUSCULAR | Status: AC
  Filled 2014-02-04: qty 10

## 2014-02-04 MED ORDER — NALOXONE HCL 1 MG/ML IJ SOLN: 1.0000 ug/kg/h | INTRAVENOUS | Status: DC | PRN

## 2014-02-04 MED ORDER — MORPHINE SULFATE (PF) 0.5 MG/ML IJ SOLN
INTRAMUSCULAR | Status: DC | PRN
  Administered 2014-02-04: 4 mg via EPIDURAL

## 2014-02-04 MED ORDER — CEFAZOLIN SODIUM-DEXTROSE 2-3 GM-% IV SOLR
2.0000 g | INTRAVENOUS | Status: AC
  Administered 2014-02-04: 2 g via INTRAVENOUS
  Filled 2014-02-04: qty 50

## 2014-02-04 MED ORDER — EPHEDRINE 5 MG/ML INJ: 10.0000 mg | INTRAVENOUS | Status: DC | PRN

## 2014-02-04 MED ORDER — ONDANSETRON HCL 4 MG PO TABS: 4.0000 mg | ORAL_TABLET | ORAL | Status: DC | PRN

## 2014-02-04 MED ORDER — DIPHENHYDRAMINE HCL 25 MG PO CAPS
25.0000 mg | ORAL_CAPSULE | ORAL | Status: DC | PRN
  Filled 2014-02-04: qty 1

## 2014-02-04 MED ORDER — LANOLIN HYDROUS EX OINT: 1.0000 "application " | TOPICAL_OINTMENT | CUTANEOUS | Status: DC | PRN

## 2014-02-04 SURGICAL SUPPLY — 35 items
BENZOIN TINCTURE PRP APPL 2/3 (GAUZE/BANDAGES/DRESSINGS) ×3 IMPLANT
CLAMP CORD UMBIL (MISCELLANEOUS) IMPLANT
CLOSURE WOUND 1/2 X4 (GAUZE/BANDAGES/DRESSINGS) ×1
CLOTH BEACON ORANGE TIMEOUT ST (SAFETY) ×3 IMPLANT
DRAPE LG THREE QUARTER DISP (DRAPES) ×3 IMPLANT
DRSG OPSITE POSTOP 4X10 (GAUZE/BANDAGES/DRESSINGS) ×3 IMPLANT
DURAPREP 26ML APPLICATOR (WOUND CARE) ×3 IMPLANT
ELECT REM PT RETURN 9FT ADLT (ELECTROSURGICAL) ×3
ELECTRODE REM PT RTRN 9FT ADLT (ELECTROSURGICAL) ×1 IMPLANT
EXTRACTOR VACUUM BELL STYLE (SUCTIONS) IMPLANT
GLOVE BIO SURGEON STRL SZ7 (GLOVE) ×3 IMPLANT
GOWN STRL REUS W/ TWL XL LVL3 (GOWN DISPOSABLE) ×1 IMPLANT
GOWN STRL REUS W/TWL LRG LVL3 (GOWN DISPOSABLE) ×3 IMPLANT
GOWN STRL REUS W/TWL XL LVL3 (GOWN DISPOSABLE) ×2
KIT ABG SYR 3ML LUER SLIP (SYRINGE) IMPLANT
NEEDLE HYPO 25X5/8 SAFETYGLIDE (NEEDLE) IMPLANT
NS IRRIG 1000ML POUR BTL (IV SOLUTION) ×3 IMPLANT
PACK C SECTION WH (CUSTOM PROCEDURE TRAY) ×3 IMPLANT
PAD OB MATERNITY 4.3X12.25 (PERSONAL CARE ITEMS) ×3 IMPLANT
RETRACTOR WND ALEXIS 25 LRG (MISCELLANEOUS) ×1 IMPLANT
RTRCTR WOUND ALEXIS 25CM LRG (MISCELLANEOUS) ×3
STAPLER VISISTAT 35W (STAPLE) IMPLANT
STRIP CLOSURE SKIN 1/2X4 (GAUZE/BANDAGES/DRESSINGS) ×2 IMPLANT
SUT MNCRL 0 VIOLET CTX 36 (SUTURE) ×4 IMPLANT
SUT MONOCRYL 0 CTX 36 (SUTURE) ×8
SUT PDS AB 0 CTX 60 (SUTURE) IMPLANT
SUT PLAIN 2 0 XLH (SUTURE) IMPLANT
SUT VIC AB 0 CT1 27 (SUTURE) ×4
SUT VIC AB 0 CT1 27XBRD ANBCTR (SUTURE) ×2 IMPLANT
SUT VIC AB 2-0 CT1 27 (SUTURE) ×2
SUT VIC AB 2-0 CT1 TAPERPNT 27 (SUTURE) ×1 IMPLANT
SUT VIC AB 4-0 KS 27 (SUTURE) ×3 IMPLANT
TOWEL OR 17X24 6PK STRL BLUE (TOWEL DISPOSABLE) ×3 IMPLANT
TRAY FOLEY CATH 14FR (SET/KITS/TRAYS/PACK) ×3 IMPLANT
WATER STERILE IRR 1000ML POUR (IV SOLUTION) ×3 IMPLANT

## 2014-02-04 NOTE — Transfer of Care (Signed)
Immediate Anesthesia Transfer of Care Note  Patient: Kristin Le, Kristin Le  Procedure(s) Performed: Procedure(s): CESAREAN SECTION (N/A)  Patient Location: PACU  Anesthesia Type:Epidural  Level of Consciousness: awake, alert , oriented and patient cooperative  Airway & Oxygen Therapy: Patient Spontanous Breathing  Post-op Assessment: Report given to PACU RN and Post -op Vital signs reviewed and stable  Post vital signs: Reviewed and stable  Complications: No apparent anesthesia complications

## 2014-02-04 NOTE — Progress Notes (Signed)
Per Henderson CloudHorvath, MD, RN to maintain q4hr CBG's throughout patient's labor.

## 2014-02-04 NOTE — Anesthesia Postprocedure Evaluation (Signed)
  Anesthesia Post Note  Patient: Kristin Le, Kristin Le  Procedure(s) Performed: Procedure(s) (LRB): CESAREAN SECTION (N/A)  Anesthesia type: epidural  Patient location: PACU  Post pain: Pain level controlled  Post assessment: Post-op Vital signs reviewed  Last Vitals:  Filed Vitals:   02/04/14 2030  BP: 107/60  Pulse: 107  Temp:   Resp: 21    Post vital signs: Reviewed  Level of consciousness: awake  Complications: No apparent anesthesia complications

## 2014-02-04 NOTE — Anesthesia Preprocedure Evaluation (Addendum)
Anesthesia Evaluation  Patient identified by MRN, date of birth, ID band Patient awake    Reviewed: Allergy & Precautions, H&P , Patient's Chart, lab work & pertinent test results  Airway Mallampati: II TM Distance: >3 FB Neck ROM: full    Dental  (+) Teeth Intact   Pulmonary  breath sounds clear to auscultation        Cardiovascular Rhythm:regular Rate:Normal     Neuro/Psych    GI/Hepatic   Endo/Other  diabetes, Gestational, Oral Hypoglycemic Agents  Renal/GU      Musculoskeletal   Abdominal   Peds  Hematology   Anesthesia Other Findings       Reproductive/Obstetrics (+) Pregnancy                          Anesthesia Physical Anesthesia Plan  ASA: II  Anesthesia Plan: Epidural   Post-op Pain Management:    Induction:   Airway Management Planned:   Additional Equipment:   Intra-op Plan:   Post-operative Plan:   Informed Consent: I have reviewed the patients History and Physical, chart, labs and discussed the procedure including the risks, benefits and alternatives for the proposed anesthesia with the patient or authorized representative who has indicated his/her understanding and acceptance.   Dental Advisory Given  Plan Discussed with:   Anesthesia Plan Comments: (Labs checked- platelets confirmed with RN in room. Fetal heart tracing, per RN, reported to be stable enough for sitting procedure. Discussed epidural, and patient consents to the procedure:  included risk of possible headache,backache, failed block, allergic reaction, and nerve injury. This patient was asked if she had any questions or concerns before the procedure started.)        Anesthesia Quick Evaluation

## 2014-02-04 NOTE — Op Note (Signed)
02/03/2014 - 02/04/2014  7:39 PM  PATIENT:  Kristin Le  28 y.o. female  PRE-OPERATIVE DIAGNOSIS:  arrest of descent  POST-OPERATIVE DIAGNOSIS:  arrest of descent  PROCEDURE:  Procedure(s): CESAREAN SECTION (N/A)  SURGEON:  Surgeon(s) and Role:    * Loney LaurenceMichelle A Derion Kreiter, MD - Primary   ANESTHESIA:   epidural  EBL:  Total I/O In: 900 [I.V.:900] Out: 725 [Urine:75; Blood:650]   SPECIMEN:  No Specimen  DISPOSITION OF SPECIMEN:  N/A  COUNTS:  YES  TOURNIQUET:  * No tourniquets in log *  DICTATION: .Note written in EPIC  PLAN OF CARE: Admit to inpatient   PATIENT DISPOSITION:  PACU - hemodynamically stable.   Delay start of Pharmacological VTE agent (>24hrs) due to surgical blood loss or risk of bleeding: not applicable  Complications:  None. Medications:  Ancef, Pitocin Findings:  Baby female, Apgars 9,9, weight P.   Normal tubes, ovaries and uterus seen.  Baby was skin to skin with mother after birth in the OR.  Technique:  After adequate epidural anesthesia was achieved, the patient was prepped and draped in usual sterile fashion.  A foley catheter was used to drain the bladder.  A pfannanstiel incision was made with the scalpel and carried down to the fascia with the bovie cautery. The fascia was incised in the midline with the scalpel and carried in a transverse curvilinear manner bilaterally.  The fascia was reflected superiorly and inferiorly off the rectus muscles and the muscles split in the midline.  A bowel free portion of the peritoneum was entered bluntly and then extended in a superior and inferior manner with good visualization of the bowel and bladder.  The Alexis instrument was then placed and the vesico-uterine fascia tented up and incised in a transverse curvilinear manner.  A 2 cm transverse incision was made in the upper portion of the lower uterine segment until the amnion was exposed.   The incision was extended transversely in a blunt manner.  Clear fluid  was noted and the baby delivered in the vertex presentation without complication.  The baby was bulb suctioned and the cord was clamped and cut.  The baby was then handed to awaiting Neonatology.  The placenta was then delivered manually and the uterus cleared of all debris.  The uterine incision was then closed with a running lock stitch of 0 monocryl.  An imbricating layer of 0 monocryl was closed as well. Excellent hemostasis of the uterine incision was achieved and the abdomen was cleared with irrigation.  The peritoneum was closed with a running stitch of 2-0 vicryl.  This incorporated the rectus muscles as a separate layer.  The fascia was then closed with a running stitch of 0 vicryl.  The subcutaneous layer was closed with interrupted  stitches of 2-0 plain gut.  The skin was closed with 4-0 vicryl on a Keith needle and steri-strips.  The patient tolerated the procedure well and was returned to the recovery room in stable condition.  All counts were correct times three.  Synai Prettyman A

## 2014-02-04 NOTE — H&P (Addendum)
28 y.o. 6549w0d  G2P1001 comes in for induction at term for A2GDM.  Otherwise has good fetal movement and no bleeding.  Past Medical History  Diagnosis Date  . H/O varicella   . Gestational diabetes     glyburide    Past Surgical History  Procedure Laterality Date  . No past surgeries      OB History  Gravida Para Term Preterm AB SAB TAB Ectopic Multiple Living  2 1 1  0 0 0 0 0 0 1    # Outcome Date GA Lbr Len/2nd Weight Sex Delivery Anes PTL Lv  2 CUR           1 TRM 07/11/12 1652w0d 15:48 / 03:00 3.549 kg (7 lb 13.2 oz) F VAC EPI  Y     Comments: Normal newborn      History   Social History  . Marital Status: Married    Spouse Name: N/A    Number of Children: N/A  . Years of Education: N/A   Occupational History  . Not on file.   Social History Main Topics  . Smoking status: Never Smoker   . Smokeless tobacco: Never Used  . Alcohol Use: No  . Drug Use: No  . Sexual Activity: Yes   Other Topics Concern  . Not on file   Social History Narrative  . No narrative on file   Review of patient's allergies indicates no known allergies.    Prenatal Transfer Tool  Maternal Diabetes: Yes:  Diabetes Type:  Insulin/Medication controlled- A2GDM on glyburide; antenatal testing was normal Genetic Screening: Normal Maternal Ultrasounds/Referrals: Normal Fetal Ultrasounds or other Referrals:  None Maternal Substance Abuse:  No Significant Maternal Medications:  Meds include: Other: glyburide Significant Maternal Lab Results: None  Other PNC: uncomplicated.    Filed Vitals:   02/04/14 0721  BP: 111/63  Pulse: 88  Temp:   Resp: 20     Lungs/Cor:  NAD Abdomen:  soft, gravid Ex:  no cords, erythema SVE:  5-6/70/-2, AROM clear FHTs:  140s, good STV, NST R Toco:  q3-4   A/P   Term induction for A2GDM.  GBS neg.  Last US at 38 weeks EFW was 7#9.  Marquin Patino A

## 2014-02-04 NOTE — Anesthesia Procedure Notes (Signed)

## 2014-02-04 NOTE — Brief Op Note (Signed)
02/03/2014 - 02/04/2014  7:39 PM  PATIENT:  Kristin Le  28 y.o. female  PRE-OPERATIVE DIAGNOSIS:  arrest of descent  POST-OPERATIVE DIAGNOSIS:  arrest of descent  PROCEDURE:  Procedure(s): CESAREAN SECTION (N/A)  SURGEON:  Surgeon(s) and Role:    * [name] A [name], MD - Primary   ANESTHESIA:   epidural  EBL:  Total I/O In: 900 [I.V.:900] Out: 725 [Urine:75; Blood:650]   SPECIMEN:  No Specimen  DISPOSITION OF SPECIMEN:  N/A  COUNTS:  YES  TOURNIQUET:  * No tourniquets in log *  DICTATION: .Note written in EPIC  PLAN OF CARE: Admit to inpatient   PATIENT DISPOSITION:  PACU - hemodynamically stable.   Delay start of Pharmacological VTE agent (>24hrs) due to surgical blood loss or risk of bleeding: not applicable  Complications:  None. Medications:  Ancef, Pitocin Findings:  Baby female, Apgars 9,9, weight P.   Normal tubes, ovaries and uterus seen.  Baby was skin to skin with mother after birth in the OR.  Technique:  After adequate epidural anesthesia was achieved, the patient was prepped and draped in usual sterile fashion.  A foley catheter was used to drain the bladder.  A pfannanstiel incision was made with the scalpel and carried down to the fascia with the bovie cautery. The fascia was incised in the midline with the scalpel and carried in a transverse curvilinear manner bilaterally.  The fascia was reflected superiorly and inferiorly off the rectus muscles and the muscles split in the midline.  A bowel free portion of the peritoneum was entered bluntly and then extended in a superior and inferior manner with good visualization of the bowel and bladder.  The Alexis instrument was then placed and the vesico-uterine fascia tented up and incised in a transverse curvilinear manner.  A 2 cm transverse incision was made in the upper portion of the lower uterine segment until the amnion was exposed.   The incision was extended transversely in a blunt manner.  Clear fluid  was noted and the baby delivered in the vertex presentation without complication.  The baby was bulb suctioned and the cord was clamped and cut.  The baby was then handed to awaiting Neonatology.  The placenta was then delivered manually and the uterus cleared of all debris.  The uterine incision was then closed with a running lock stitch of 0 monocryl.  An imbricating layer of 0 monocryl was closed as well. Excellent hemostasis of the uterine incision was achieved and the abdomen was cleared with irrigation.  The peritoneum was closed with a running stitch of 2-0 vicryl.  This incorporated the rectus muscles as a separate layer.  The fascia was then closed with a running stitch of 0 vicryl.  The subcutaneous layer was closed with interrupted  stitches of 2-0 plain gut.  The skin was closed with 4-0 vicryl on a Keith needle and steri-strips.  The patient tolerated the procedure well and was returned to the recovery room in stable condition.  All counts were correct times three.  Arnet Hofferber A    

## 2014-02-04 NOTE — Progress Notes (Signed)
Patient had protracted labor, stalling at 8-9 cm from 12:30 to about 5 pm.  She was checked at 5 pm and found to be 9.5/anterior lip and an attempt was made at pushing but was unsuccessful to push past.  Pt was made more comfortable with PCA epidural and started on Ampicillin when she spiked a fever.    Now 1 1/2 hours later she is still 9.5 and is starting to experience a lot more pain.  I am unable to fully feel for head position as she is extremely uncomfortable with any vaginal manipulation.  I counseled her and her husband about proceeding with a LTCS for arrest of active phase and they agree.  All R/B/Alt d/w pt.

## 2014-02-05 ENCOUNTER — Encounter (HOSPITAL_COMMUNITY): Payer: Self-pay | Admitting: Obstetrics and Gynecology

## 2014-02-05 LAB — CBC
HCT: 24.7 % — ABNORMAL LOW (ref 36.0–46.0)
Hemoglobin: 8.4 g/dL — ABNORMAL LOW (ref 12.0–15.0)
MCH: 31.7 pg (ref 26.0–34.0)
MCHC: 34 g/dL (ref 30.0–36.0)
MCV: 93.2 fL (ref 78.0–100.0)
Platelets: 126 10*3/uL — ABNORMAL LOW (ref 150–400)
RBC: 2.65 MIL/uL — AB (ref 3.87–5.11)
RDW: 14.9 % (ref 11.5–15.5)
WBC: 15.8 10*3/uL — ABNORMAL HIGH (ref 4.0–10.5)

## 2014-02-05 NOTE — Addendum Note (Signed)
Addendum created 02/05/14 [id number] by Angela Adamana G Lakrisha Iseman, CRNA   Modules edited: Notes Section   Notes Section:  File: [id number]

## 2014-02-05 NOTE — Progress Notes (Signed)
POD#1 Pt without complaints. Lochia-mild. Parents desire circ VSSAF IMP/ stable Plan/ Routine care.

## 2014-02-05 NOTE — Lactation Note (Signed)
This note was copied from the chart of Kristin Le. Lactation Consultation Note: Initial visit with this experienced  BF mom. She reports that baby nursed well after delivery but has been sleepy since. Reassurance given. Reviewed feeding cues and encouraged to feed whenever she sees them. No questions at present. To call for assist prn. BF brochure given with resources for support after DC.  Patient Name: Kristin Judeth PorchSejla Le Kristin Le: 02/05/2014 Reason for consult: Initial assessment   Maternal Data Formula Feeding for Exclusion: No Infant to breast within first hour of birth: Yes Has patient been taught Hand Expression?: Yes Does the patient have breastfeeding experience prior to this delivery?: Yes  Feeding    LATCH Score/Interventions                      Lactation Tools Discussed/Used     Consult Status Consult Status: Follow-up Le: 02/05/14 Follow-up type: In-patient    Pamelia HoitWeeks, Daymen Hassebrock D 02/05/2014, 12:32 PM

## 2014-02-05 NOTE — Anesthesia Postprocedure Evaluation (Signed)
  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

## 2014-02-06 MED ORDER — OXYCODONE-ACETAMINOPHEN 5-325 MG PO TABS: 1.0000 | ORAL_TABLET | ORAL | Status: DC | PRN

## 2014-02-06 NOTE — Discharge Summary (Signed)
Obstetric Discharge Summary Reason for Admission: induction of labor Prenatal Procedures: NST, ultrasound and Diabetes mgmt Intrapartum Procedures: cesarean: low cervical, transverse Postpartum Procedures: none Complications-Operative and Postpartum: none Hemoglobin  Date Value Ref Range Status  02/05/2014 8.4* 12.0 - 15.0 g/dL Final     DELTA CHECK NOTED     REPEATED TO VERIFY     HCT  Date Value Ref Range Status  02/05/2014 24.7* 36.0 - 46.0 % Final    Physical Exam:  General: alert Lochia: appropriate Uterine Fundus: firm Incision: healing well DVT Evaluation: No evidence of DVT seen on physical exam.  Discharge Diagnoses: Term Pregnancy-delivered, Failed induction and Gestational Diabetes  Discharge Information: Date: 02/06/2014 Activity: pelvic rest Diet: routine Medications: PNV, Ibuprofen, Iron and Percocet Condition: stable Instructions: refer to practice specific booklet Discharge to: home Follow-up Information   Follow up with HORVATH,MICHELLE A, MD. Schedule an appointment as soon as possible for a visit in 4 weeks.   Specialty:  Obstetrics and Gynecology   Contact information:   [address] [place] [phone]       Newborn Data: Live born female  Birth Weight: 8 lb 8.7 oz (3875 g) APGAR: 9, 9  Home with mother.  ANDERSON,MARK E 02/06/2014, 8:40 AM

## 2014-02-06 NOTE — Lactation Note (Signed)
This note was copied from the chart of Kristin Le. Lactation Consultation Note Follow up consult:  Baby recently circumcised and sleeping.  Reviewed hand pump use, cluster feeding, cue based feeding, and engorgement care.  Left LC phone number and encouraged mother to call for further assistance if needed when baby wakes.   Patient Name: Kristin Le NWGNF'AToday's Date: 02/06/2014 Reason for consult: Follow-up assessment   Maternal Data    Feeding    LATCH Score/Interventions                      Lactation Tools Discussed/Used     Consult Status Consult Status: PRN    Hardie PulleyBerkelhammer, Aedan Geimer Boschen 02/06/2014, 9:00 AM

## 2014-02-06 NOTE — Progress Notes (Signed)
POD#2 Pt without complaints. Would like to go home. Ambulating well VSSAF IMP/ stable Plan/ Will discharge to home.

## 2014-10-11 ENCOUNTER — Encounter (HOSPITAL_COMMUNITY): Payer: Self-pay | Admitting: Obstetrics and Gynecology

## 2017-01-04 ENCOUNTER — Ambulatory Visit: Payer: Self-pay

## 2017-09-25 ENCOUNTER — Other Ambulatory Visit: Payer: Self-pay | Admitting: Obstetrics & Gynecology

## 2017-09-26 ENCOUNTER — Encounter (HOSPITAL_COMMUNITY): Payer: Self-pay | Admitting: *Deleted

## 2017-09-27 ENCOUNTER — Ambulatory Visit (HOSPITAL_COMMUNITY): Payer: Managed Care, Other (non HMO) | Admitting: Anesthesiology

## 2017-09-27 ENCOUNTER — Ambulatory Visit (HOSPITAL_COMMUNITY)
Admission: AD | Admit: 2017-09-27 | Discharge: 2017-09-27 | Disposition: A | Discharge status: Home / Self Care | Payer: Managed Care, Other (non HMO) | Source: Ambulatory Visit | Attending: Obstetrics & Gynecology | Admitting: Obstetrics & Gynecology

## 2017-09-27 ENCOUNTER — Encounter (HOSPITAL_COMMUNITY)
Admission: AD | Disposition: A | Discharge status: Home / Self Care | Payer: Self-pay | Source: Ambulatory Visit | Attending: Obstetrics & Gynecology

## 2017-09-27 ENCOUNTER — Encounter (HOSPITAL_COMMUNITY): Payer: Self-pay | Admitting: Anesthesiology

## 2017-09-27 DIAGNOSIS — O021 Missed abortion: Secondary | ICD-10-CM | POA: Insufficient documentation

## 2017-09-27 DIAGNOSIS — Z8249 Family history of ischemic heart disease and other diseases of the circulatory system: Secondary | ICD-10-CM | POA: Diagnosis not present

## 2017-09-27 HISTORY — PX: DILATION AND EVACUATION: SHX1459

## 2017-09-27 LAB — TYPE AND SCREEN
ABO/RH(D): A POS
ANTIBODY SCREEN: NEGATIVE

## 2017-09-27 LAB — CBC
HCT: 33.6 % — ABNORMAL LOW (ref 36.0–46.0)
HEMOGLOBIN: 11.1 g/dL — AB (ref 12.0–15.0)
MCH: 30.9 pg (ref 26.0–34.0)
MCHC: 33 g/dL (ref 30.0–36.0)
MCV: 93.6 fL (ref 78.0–100.0)
Platelets: 253 10*3/uL (ref 150–400)
RBC: 3.59 MIL/uL — AB (ref 3.87–5.11)
RDW: 13 % (ref 11.5–15.5)
WBC: 9.1 10*3/uL (ref 4.0–10.5)

## 2017-09-27 LAB — ABO/RH: ABO/RH(D): A POS

## 2017-09-27 SURGERY — DILATION AND EVACUATION, UTERUS
Anesthesia: Monitor Anesthesia Care | Site: Vagina

## 2017-09-27 MED ORDER — FENTANYL CITRATE (PF) 100 MCG/2ML IJ SOLN
INTRAMUSCULAR | Status: DC | PRN
  Administered 2017-09-27 (×2): 50 ug via INTRAVENOUS

## 2017-09-27 MED ORDER — PROPOFOL 500 MG/50ML IV EMUL
INTRAVENOUS | Status: DC | PRN
  Administered 2017-09-27: 100 ug/kg/min via INTRAVENOUS

## 2017-09-27 MED ORDER — ACETAMINOPHEN 160 MG/5ML PO SOLN
ORAL | Status: AC
  Administered 2017-09-27: 975 mg via ORAL
  Filled 2017-09-27: qty 40.6

## 2017-09-27 MED ORDER — LIDOCAINE HCL 1 % IJ SOLN
INTRAMUSCULAR | Status: AC
  Filled 2017-09-27: qty 1

## 2017-09-27 MED ORDER — LACTATED RINGERS IV SOLN
INTRAVENOUS | Status: DC
  Administered 2017-09-27 (×2): via INTRAVENOUS

## 2017-09-27 MED ORDER — MIDAZOLAM HCL 5 MG/5ML IJ SOLN
INTRAMUSCULAR | Status: DC | PRN
  Administered 2017-09-27: 2 mg via INTRAVENOUS

## 2017-09-27 MED ORDER — KETOROLAC TROMETHAMINE 30 MG/ML IJ SOLN
INTRAMUSCULAR | Status: AC
  Filled 2017-09-27: qty 1

## 2017-09-27 MED ORDER — PROPOFOL 10 MG/ML IV BOLUS
INTRAVENOUS | Status: AC
  Filled 2017-09-27: qty 20

## 2017-09-27 MED ORDER — HYDROCODONE-ACETAMINOPHEN 5-325 MG PO TABS: 1.0000 | ORAL_TABLET | Freq: Four times a day (QID) | ORAL | 0 refills | Status: AC | PRN

## 2017-09-27 MED ORDER — DOXYCYCLINE HYCLATE 50 MG PO CAPS: 100.0000 mg | ORAL_CAPSULE | Freq: Two times a day (BID) | ORAL | 0 refills | Status: AC

## 2017-09-27 MED ORDER — SCOPOLAMINE 1 MG/3DAYS TD PT72
MEDICATED_PATCH | TRANSDERMAL | Status: AC
  Administered 2017-09-27: 1.5 mg via TRANSDERMAL
  Filled 2017-09-27: qty 1

## 2017-09-27 MED ORDER — KETOROLAC TROMETHAMINE 30 MG/ML IJ SOLN
INTRAMUSCULAR | Status: DC | PRN
  Administered 2017-09-27: 30 mg via INTRAVENOUS

## 2017-09-27 MED ORDER — SCOPOLAMINE 1 MG/3DAYS TD PT72
1.0000 | MEDICATED_PATCH | Freq: Once | TRANSDERMAL | Status: DC
  Administered 2017-09-27: 1.5 mg via TRANSDERMAL

## 2017-09-27 MED ORDER — ONDANSETRON HCL 4 MG/2ML IJ SOLN
INTRAMUSCULAR | Status: DC | PRN
  Administered 2017-09-27: 4 mg via INTRAVENOUS

## 2017-09-27 MED ORDER — IBUPROFEN 800 MG PO TABS: 800.0000 mg | ORAL_TABLET | Freq: Three times a day (TID) | ORAL | 3 refills | Status: AC | PRN

## 2017-09-27 MED ORDER — SILVER NITRATE-POT NITRATE 75-25 % EX MISC
CUTANEOUS | Status: DC | PRN
  Administered 2017-09-27: 2 via TOPICAL

## 2017-09-27 MED ORDER — LIDOCAINE HCL (CARDIAC) 20 MG/ML IV SOLN
INTRAVENOUS | Status: AC
  Filled 2017-09-27: qty 5

## 2017-09-27 MED ORDER — FENTANYL CITRATE (PF) 100 MCG/2ML IJ SOLN
INTRAMUSCULAR | Status: AC
Start: 2017-09-27 — End: 2017-09-27
  Filled 2017-09-27: qty 2

## 2017-09-27 MED ORDER — FENTANYL CITRATE (PF) 100 MCG/2ML IJ SOLN
INTRAMUSCULAR | Status: DC
Start: 2017-09-27 — End: 2017-09-27
  Filled 2017-09-27: qty 2

## 2017-09-27 MED ORDER — MIDAZOLAM HCL 2 MG/2ML IJ SOLN
INTRAMUSCULAR | Status: AC
  Filled 2017-09-27: qty 2

## 2017-09-27 MED ORDER — LIDOCAINE HCL 1 % IJ SOLN
INTRAMUSCULAR | Status: DC | PRN
  Administered 2017-09-27: 10 mL

## 2017-09-27 MED ORDER — DEXAMETHASONE SODIUM PHOSPHATE 10 MG/ML IJ SOLN
INTRAMUSCULAR | Status: DC | PRN
  Administered 2017-09-27: 10 mg via INTRAVENOUS

## 2017-09-27 MED ORDER — FENTANYL CITRATE (PF) 100 MCG/2ML IJ SOLN
25.0000 ug | INTRAMUSCULAR | Status: DC | PRN
  Administered 2017-09-27: 25 ug via INTRAVENOUS

## 2017-09-27 MED ORDER — ACETAMINOPHEN 160 MG/5ML PO SOLN
975.0000 mg | Freq: Once | ORAL | Status: AC
  Administered 2017-09-27: 975 mg via ORAL

## 2017-09-27 MED ORDER — DEXAMETHASONE SODIUM PHOSPHATE 10 MG/ML IJ SOLN
INTRAMUSCULAR | Status: AC
  Filled 2017-09-27: qty 1

## 2017-09-27 MED ORDER — ONDANSETRON HCL 4 MG/2ML IJ SOLN
INTRAMUSCULAR | Status: AC
  Filled 2017-09-27: qty 2

## 2017-09-27 SURGICAL SUPPLY — 19 items
CATH ROBINSON RED A/P 16FR (CATHETERS) ×3 IMPLANT
DECANTER SPIKE VIAL GLASS SM (MISCELLANEOUS) ×3 IMPLANT
DILATOR CANAL MILEX (MISCELLANEOUS) IMPLANT
GLOVE BIO SURGEON STRL SZ 6.5 (GLOVE) ×2 IMPLANT
GLOVE BIO SURGEONS STRL SZ 6.5 (GLOVE) ×1
GLOVE BIOGEL PI IND STRL 7.0 (GLOVE) ×3 IMPLANT
GLOVE BIOGEL PI INDICATOR 7.0 (GLOVE) ×6
GOWN STRL REUS W/TWL LRG LVL3 (GOWN DISPOSABLE) ×6 IMPLANT
KIT BERKELEY 1ST TRIMESTER 3/8 (MISCELLANEOUS) ×3 IMPLANT
NS IRRIG 1000ML POUR BTL (IV SOLUTION) ×3 IMPLANT
PACK VAGINAL MINOR WOMEN LF (CUSTOM PROCEDURE TRAY) ×3 IMPLANT
PAD OB MATERNITY 4.3X12.25 (PERSONAL CARE ITEMS) ×3 IMPLANT
PAD PREP 24X48 CUFFED NSTRL (MISCELLANEOUS) ×3 IMPLANT
SET BERKELEY SUCTION TUBING (SUCTIONS) ×3 IMPLANT
TOWEL OR 17X24 6PK STRL BLUE (TOWEL DISPOSABLE) ×6 IMPLANT
VACURETTE 10 RIGID CVD (CANNULA) IMPLANT
VACURETTE 7MM CVD STRL WRAP (CANNULA) IMPLANT
VACURETTE 8 RIGID CVD (CANNULA) IMPLANT
VACURETTE 9 RIGID CVD (CANNULA) ×3 IMPLANT

## 2017-09-27 NOTE — Anesthesia Postprocedure Evaluation (Signed)
Anesthesia Post Note  Patient: Set designer  Procedure(s) Performed: DILATATION AND EVACUATION (N/A Vagina )     Patient location during evaluation: PACU Anesthesia Type: MAC Level of consciousness: awake and alert Pain management: pain level controlled Vital Signs Assessment: post-procedure vital signs reviewed and stable Respiratory status: spontaneous breathing, nonlabored ventilation, respiratory function stable and patient connected to nasal cannula oxygen Cardiovascular status: stable and blood pressure returned to baseline Postop Assessment: no apparent nausea or vomiting Anesthetic complications: no    Last Vitals:  Vitals:   09/27/17 1300 09/27/17 1330  BP: (!) 88/57 (!) 103/59  Pulse: 64 68  Resp: 17 16  Temp:  36.9 C  SpO2: 95% 100%    Last Pain:  Vitals:   09/27/17 1330  TempSrc:   PainSc: 0-No pain   Pain Goal: Patients Stated Pain Goal: 3 (09/27/17 1330)               Lyndle Herrlich EDWARD

## 2017-09-27 NOTE — Discharge Instructions (Signed)

## 2017-09-27 NOTE — Brief Op Note (Signed)
09/27/2017  11:04 AM  PATIENT:  Kristin Le  31 y.o. female  PRE-OPERATIVE DIAGNOSIS:  Missed Abortion  POST-OPERATIVE DIAGNOSIS:  Missed Abortion  PROCEDURE:  Procedure(s): DILATATION AND EVACUATION (N/A)  SURGEON:  Surgeon(s) and Role:    * Sanjuana Kava, MD - Primary  PHYSICIAN ASSISTANT:   ASSISTANTS: none   ANESTHESIA:   local and MAC  EBL:  30 mL   BLOOD ADMINISTERED:none  DRAINS: none   LOCAL MEDICATIONS USED:  LIDOCAINE  and Amount: 10 ml  SPECIMEN:  Source of Specimen:  POC  DISPOSITION OF SPECIMEN:  PATHOLOGY  COUNTS:  YES  TOURNIQUET:  * No tourniquets in log *  DICTATION: .Note written in EPIC  PLAN OF CARE: Discharge to home after PACU  PATIENT DISPOSITION:  PACU - hemodynamically stable.   Delay start of Pharmacological VTE agent (>24hrs) due to surgical blood loss or risk of bleeding: not applicable

## 2017-09-27 NOTE — Op Note (Signed)
Operative Report  Kristin PorchSejla Le female 31 y.o. @DATE @  Procedure:    * DILATATION AND EVACUATION - Choice  Preoperative Diagnosis:  8 week missed ab  Post operative Diagnosis:  same   Indications: The patient with retained products of conception after failed medical management     Surgeon: Essie HartWalda Carri Spillers   Assistants: none  Anesthesia: General nasal mask inhalational anesthesia and Local anesthesia 1% buffered lidocaine  ASA Class: 2  Procedure Detail  DILATATION AND EVACUATION  Findings:  8 week size uterus to 6 size post procedure. Moderate amount of retained POC.  Good crie was achieved.  Estimated Blood Loss:  30 mL         Drains: straight cath prior to procedure         Total IV Fluids:  @1000  ml  Blood Given: none          Specimens: POC         Implants: none        Complications:  None         Technique:   Patient was placed in dorsal lithotomy position in [employer]. After adequate anesthesia was achieved, the patient was prepped and draped in the usual sterile fashion. The operative Graves speculum was placed in the vagina and the cervix stabilized with a single-tooth tenaculum.  A paracervical block using 1% lidocaine was performed. The cervix was dilated with Hank dilators and the 9 mm curette was used to remove contents of the uterus.  Alternating sharp curettage with a curette and suction curettage was performed until all contents were removed and good crie was achieved.  All instruments were removed from the vagina.  The patient tolerated the procedure well.    Disposition: PACU - hemodynamically stable.         Condition: stable  [name]

## 2017-09-27 NOTE — Transfer of Care (Signed)
Immediate Anesthesia Transfer of Care Note  Patient: Set designer  Procedure(s) Performed: DILATATION AND EVACUATION (N/A Vagina )  Patient Location: PACU  Anesthesia Type:MAC  Level of Consciousness: sedated  Airway & Oxygen Therapy: Patient Spontanous Breathing and Patient connected to nasal cannula oxygen  Post-op Assessment: Report given to RN and Post -op Vital signs reviewed and stable  Post vital signs: Reviewed and stable  Last Vitals:  Vitals:   09/27/17 0941  BP: 94/63  Pulse: 72  Resp: 16  Temp: 36.8 C  SpO2: 100%    Last Pain:  Vitals:   09/27/17 0941  TempSrc: Oral      Patients Stated Pain Goal: 4 (81/84/03 7543)  Complications: No apparent anesthesia complications

## 2017-09-27 NOTE — H&P (Signed)
Kristin Le is an 31 y.o. female  Presents for scheduled D&E for failed medical management of a missed ab with misoprostol.  US in office shows retained POC.  Patient also notes some odor, no heavy bleeding presently, no pain.   Pertinent Gynecological History: Menses: normal Bleeding: normal Contraception: none DES exposure: denies Blood transfusions: none Sexually transmitted diseases: no past history Previous GYN Procedures: c-section  Last mammogram: n/a Last pap: normal Date: 2017 OB History: G3, P1   Menstrual History: Menarche age: [age] Patient's last menstrual period was 07/05/2017.    Past Medical History:  Diagnosis Date  . H/O varicella   . SVD (spontaneous vaginal delivery)    x 1    Past Surgical History:  Procedure Laterality Date  . CESAREAN SECTION N/A 02/04/2014   Procedure: CESAREAN SECTION;  Surgeon: Loney LaurenceMichelle A Horvath, MD;  Location: [facility];  Service: Obstetrics;  Laterality: N/A;    Family History  Problem Relation Age of Onset  . Heart disease Maternal Grandfather     Social History:  reports that she has never smoked. She has never used smokeless tobacco. She reports that she does not drink alcohol or use drugs.  Allergies: No Known Allergies  Prescriptions Prior to Admission  Medication Sig Dispense Refill Last Dose  . Biotin 1000 MCG tablet Take 1,000 mcg by mouth daily.   Past Week at Unknown time  . ferrous sulfate 325 (65 FE) MG EC tablet Take 325 mg by mouth 2 (two) times a week.   Past Week at Unknown time    ROS  As per HPI  Blood pressure 94/63, pulse 72, temperature 98.2 F (36.8 C), temperature source Oral, resp. rate 16, height 5' (1.524 m), weight 49.9 kg (110 lb), last menstrual period 07/05/2017, SpO2 100 %, unknown if currently breastfeeding. Physical Exam  Deferred to OR  Results for orders placed or performed during the hospital encounter of 09/27/17 (from the past 24 hour(s))  CBC     Status: Abnormal   Collection Time:  09/27/17  9:33 AM  Result Value Ref Range   WBC 9.1 4.0 - 10.5 K/uL   RBC 3.59 (L) 3.87 - 5.11 MIL/uL   Hemoglobin 11.1 (L) 12.0 - 15.0 g/dL   HCT 16.133.6 (L) 09.636.0 - 04.546.0 %   MCV 93.6 78.0 - 100.0 fL   MCH 30.9 26.0 - 34.0 pg   MCHC 33.0 30.0 - 36.0 g/dL   RDW 40.913.0 81.111.5 - 91.415.5 %   Platelets 253 150 - 400 K/uL    No results found.  Assessment/Plan: 31 yo s/p 8 week missed ab, treated with misoprostol, now with  Retained POC.  On call to OR for D&E Doxycycline for surgical prophylaxis A positive  Kristin Le 09/27/2017, 10:16 AM

## 2017-09-27 NOTE — Anesthesia Preprocedure Evaluation (Addendum)
Anesthesia Evaluation  Patient identified by MRN, date of birth, ID band Patient awake    Reviewed: Allergy & Precautions, H&P , Patient's Chart, lab work & pertinent test results, reviewed documented beta blocker date and time   Airway Mallampati: II  TM Distance: >3 FB Neck ROM: full    Dental no notable dental hx.    Pulmonary    Pulmonary exam normal breath sounds clear to auscultation       Cardiovascular  Rhythm:regular Rate:Normal     Neuro/Psych    GI/Hepatic   Endo/Other    Renal/GU      Musculoskeletal   Abdominal   Peds  Hematology   Anesthesia Other Findings   Reproductive/Obstetrics                             Anesthesia Physical Anesthesia Plan  ASA: II  Anesthesia Plan: MAC   Post-op Pain Management:    Induction: Intravenous  PONV Risk Score and Plan: 2 and Ondansetron and Dexamethasone  Airway Management Planned: Mask  Additional Equipment:   Intra-op Plan:   Post-operative Plan:   Informed Consent: I have reviewed the patients History and Physical, chart, labs and discussed the procedure including the risks, benefits and alternatives for the proposed anesthesia with the patient or authorized representative who has indicated his/her understanding and acceptance.   Dental Advisory Given  Plan Discussed with: CRNA and Surgeon  Anesthesia Plan Comments: ( MAC v LMA)       Anesthesia Quick Evaluation

## 2017-09-28 ENCOUNTER — Encounter (HOSPITAL_COMMUNITY): Payer: Self-pay | Admitting: Obstetrics & Gynecology

## 2017-09-30 ENCOUNTER — Encounter (HOSPITAL_COMMUNITY): Payer: Self-pay | Admitting: Obstetrics & Gynecology

## 2017-09-30 NOTE — Addendum Note (Signed)
Addendum  created 09/30/17 1257 by Donney DiceKelly, Terie Lear D, CRNA   Charge Capture section accepted

## 2021-05-25 ENCOUNTER — Other Ambulatory Visit: Payer: Self-pay | Admitting: Family Medicine

## 2021-05-25 DIAGNOSIS — N644 Mastodynia: Secondary | ICD-10-CM

## 2021-07-20 ENCOUNTER — Other Ambulatory Visit: Payer: Self-pay

## 2021-07-20 ENCOUNTER — Ambulatory Visit
Admission: RE | Admit: 2021-07-20 | Discharge: 2021-07-20 | Disposition: A | Discharge status: Home / Self Care | Payer: Managed Care, Other (non HMO) | Source: Ambulatory Visit | Attending: Family Medicine | Admitting: Family Medicine

## 2021-07-20 DIAGNOSIS — N644 Mastodynia: Secondary | ICD-10-CM

## 2022-06-24 IMAGING — US US BREAST*L* LIMITED INC AXILLA
1 series · 3 of 3 positions shown · non-contrast
Comparison: None.

CLINICAL DATA: 35-year-old female presenting for baseline mammogram
to evaluate diffuse lateral left breast pain. On clinical breast
exam, a possible mass was identified in the lateral left breast. No
family history of breast cancer.

EXAM:
DIGITAL DIAGNOSTIC BILATERAL MAMMOGRAM WITH TOMOSYNTHESIS AND CAD;
ULTRASOUND LEFT BREAST LIMITED
TECHNIQUE: Bilateral digital diagnostic mammography and breast tomosynthesis
was performed. The images were evaluated with computer-aided
detection.; Targeted ultrasound examination of the left breast was
performed.

[Series 1: us breast*left* limited inc axilla · 0.06mm/px · 3 of 3 slices shown]
[im 1/3]
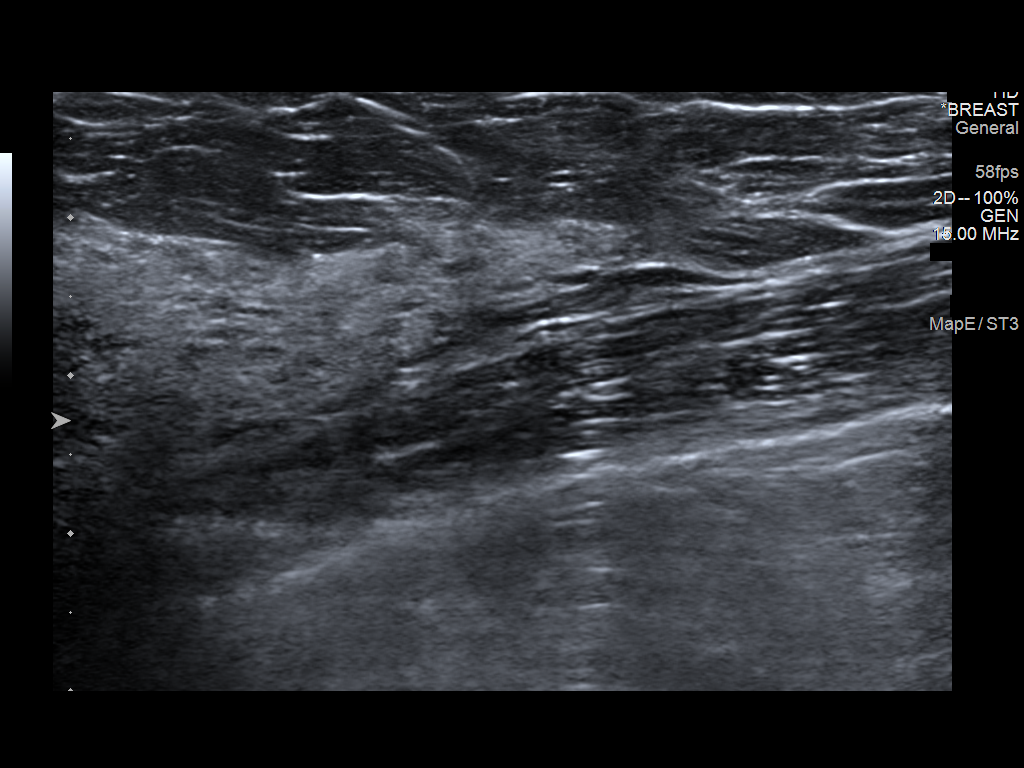
[im 2/3]
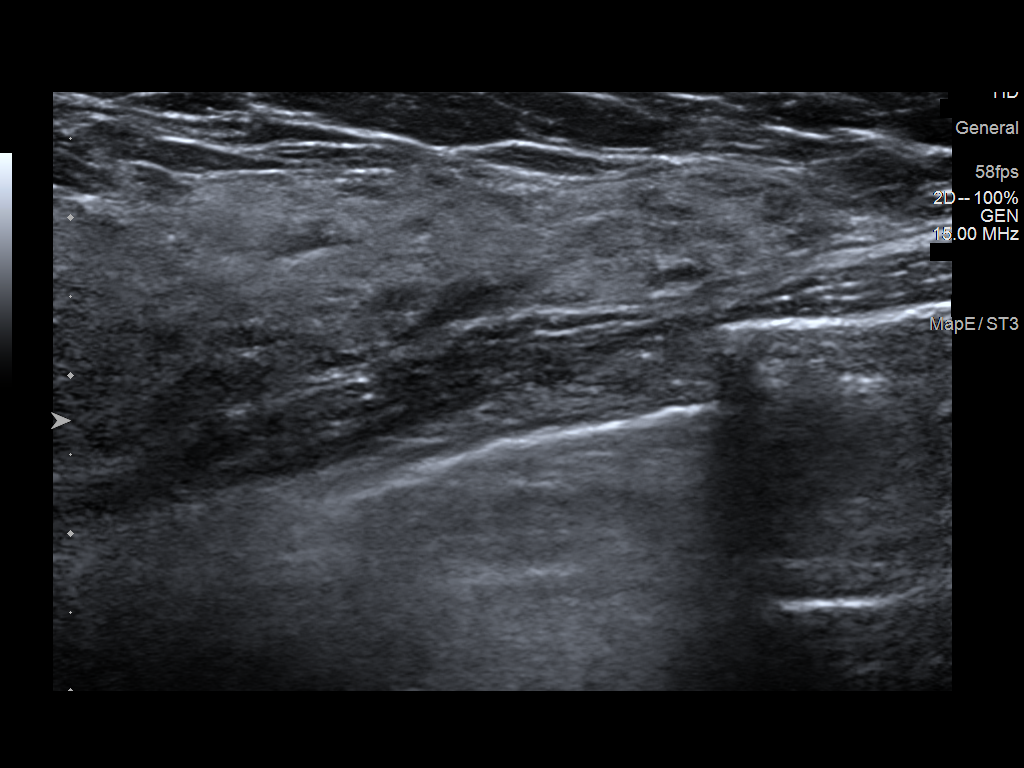
[im 3/3]
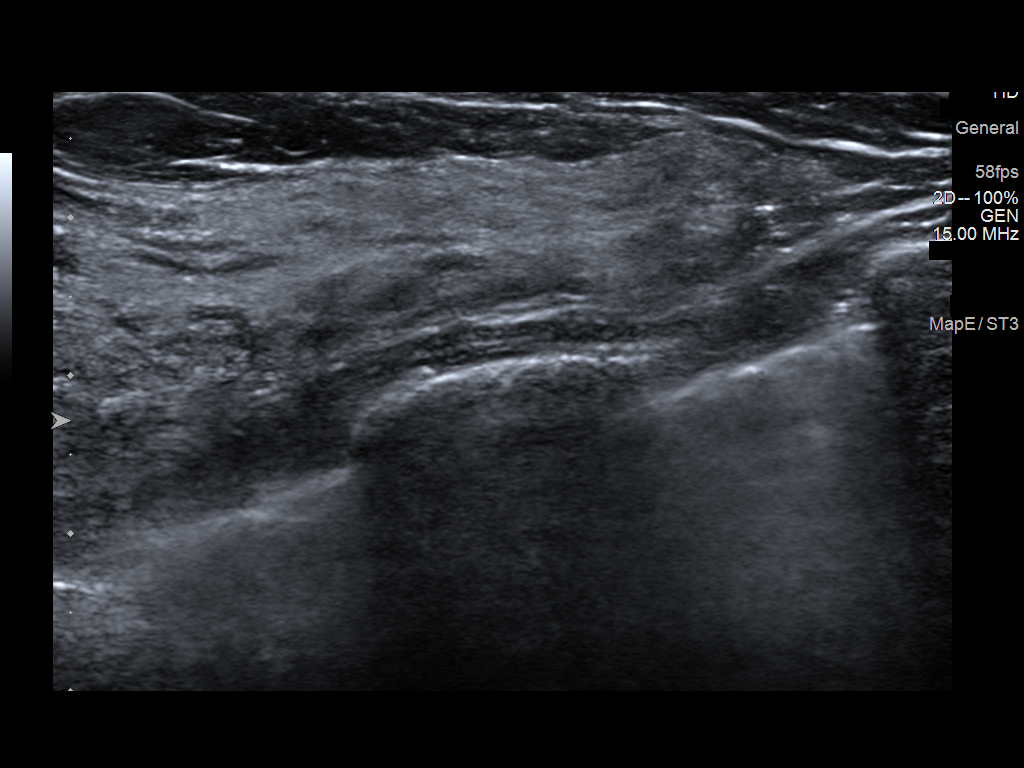

[3 of 3 positions shown; findings below may reference images not displayed]

ACR Breast Density Category d: The breast tissue is extremely dense,
which lowers the sensitivity of mammography.
FINDINGS: No suspicious calcifications, masses or areas of distortion are seen
in the bilateral breasts.

No discrete palpable masses are identified in the lateral aspect of
the left breast on clinical exam.

Ultrasound targeted to the upper-outer and lower-outer quadrants of
the left breast demonstrates normal fibroglandular tissue. No
suspicious masses or areas of shadowing are identified.
IMPRESSION: 1. There are no which is mammographic or or targeted sonographic
abnormalities in the lateral aspect of the left breast to explain
the patient's left breast pain, or the questioned palpable lump
identified on clinical breast exam.

2.  No mammographic evidence of malignancy in the bilateral breasts.

RECOMMENDATION:
1. Clinical follow-up recommended for the palpable area of concern
in the lateral left breast and the patient's lateral left breast
pain. Any further workup should be based on clinical grounds.

2. Screening mammogram at age 40 unless there are persistent or
intervening clinical concerns. (Code:OT-E-PJS)

I have discussed the findings and recommendations with the patient.
If applicable, a reminder letter will be sent to the patient
regarding the next appointment.

BI-RADS CATEGORY  1: Negative.

## 2022-06-24 IMAGING — MG DIGITAL DIAGNOSTIC BILAT W/ TOMO W/ CAD
6 of 10 series · 6 of 30 positions shown · non-contrast
Comparison: None.

CLINICAL DATA: 35-year-old female presenting for baseline mammogram
to evaluate diffuse lateral left breast pain. On clinical breast
exam, a possible mass was identified in the lateral left breast. No
family history of breast cancer.

EXAM:
DIGITAL DIAGNOSTIC BILATERAL MAMMOGRAM WITH TOMOSYNTHESIS AND CAD;
ULTRASOUND LEFT BREAST LIMITED
TECHNIQUE: Bilateral digital diagnostic mammography and breast tomosynthesis
was performed. The images were evaluated with computer-aided
detection.; Targeted ultrasound examination of the left breast was
performed.

[L CC synth-2D]
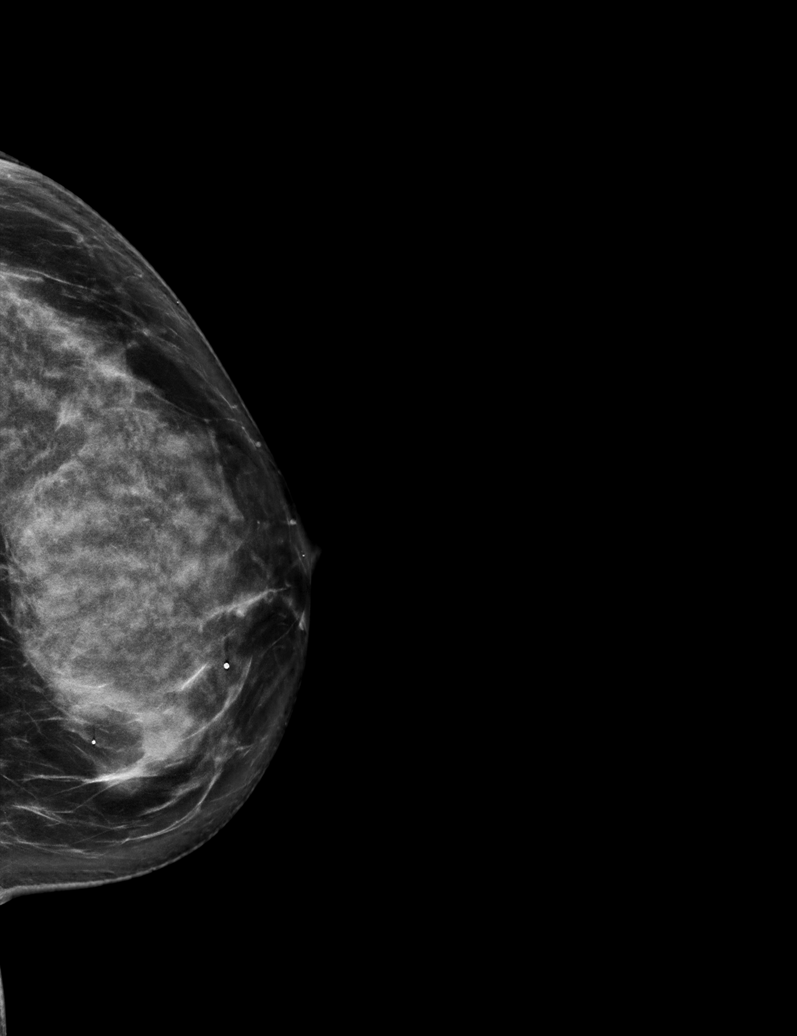

[R MLO synth-2D]
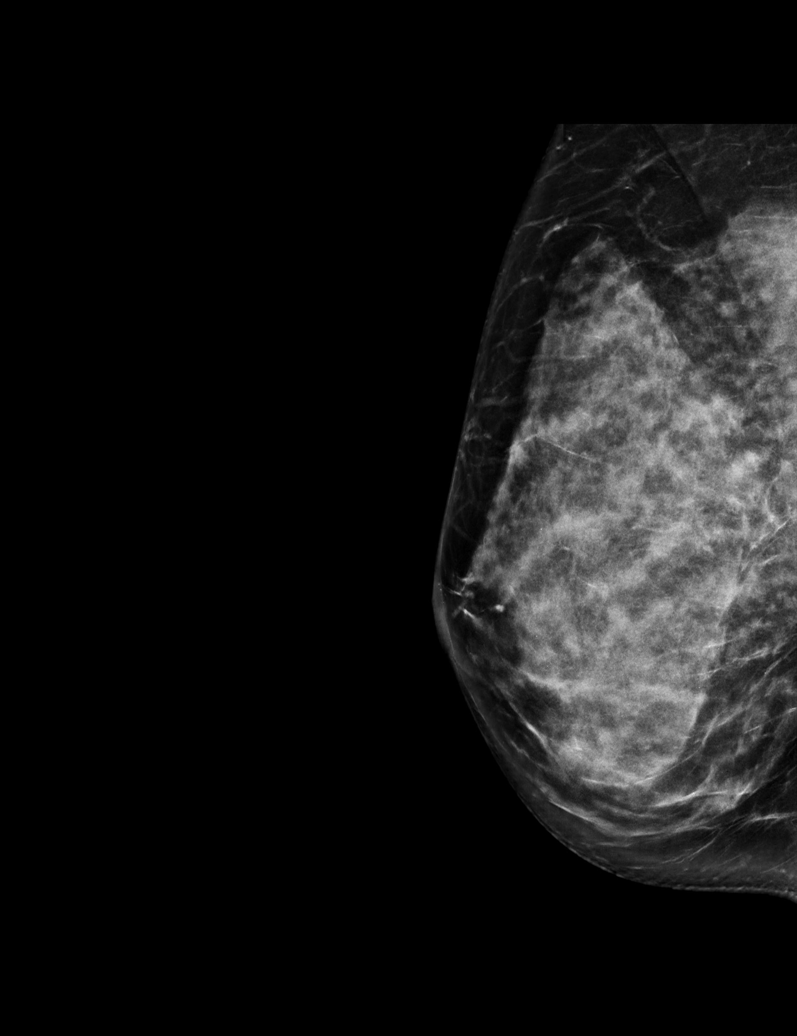

[L XCCL synth-2D]
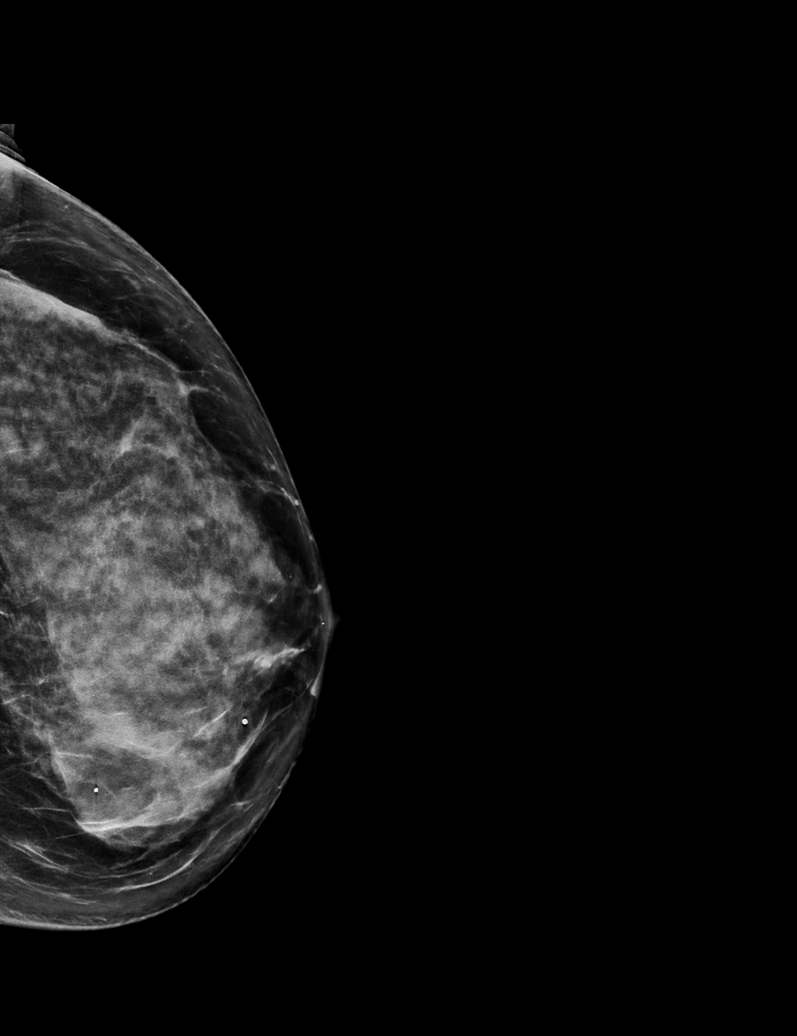

[L MLO synth-2D]
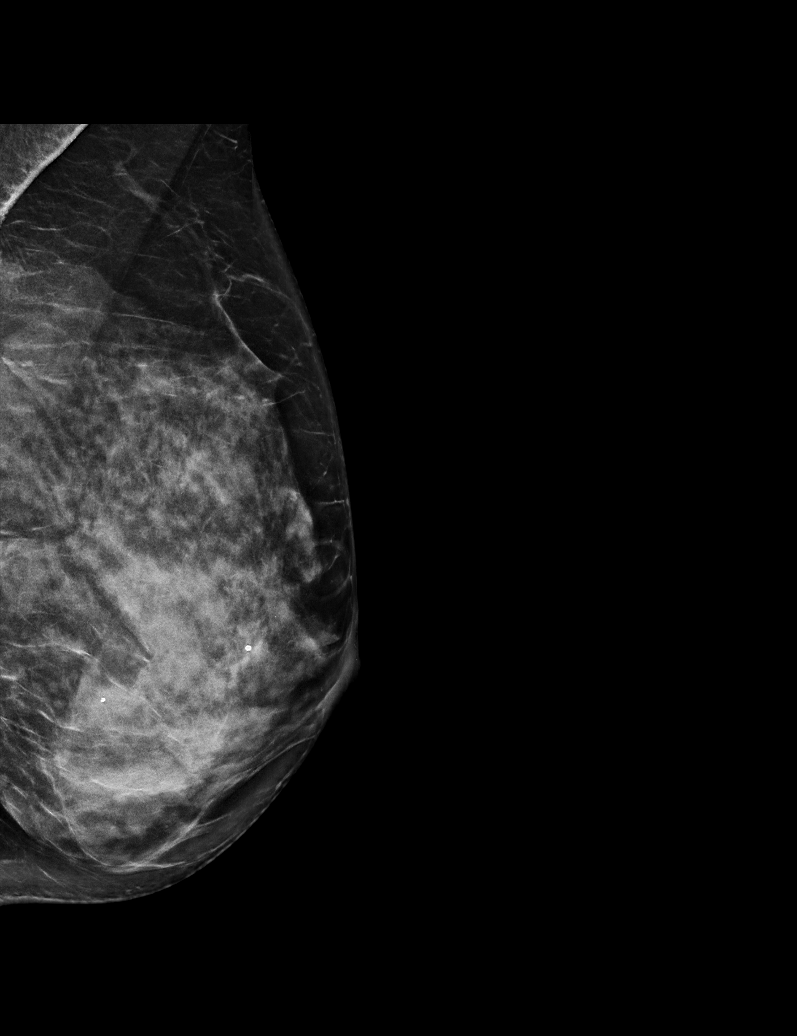

[R CC synth-2D]
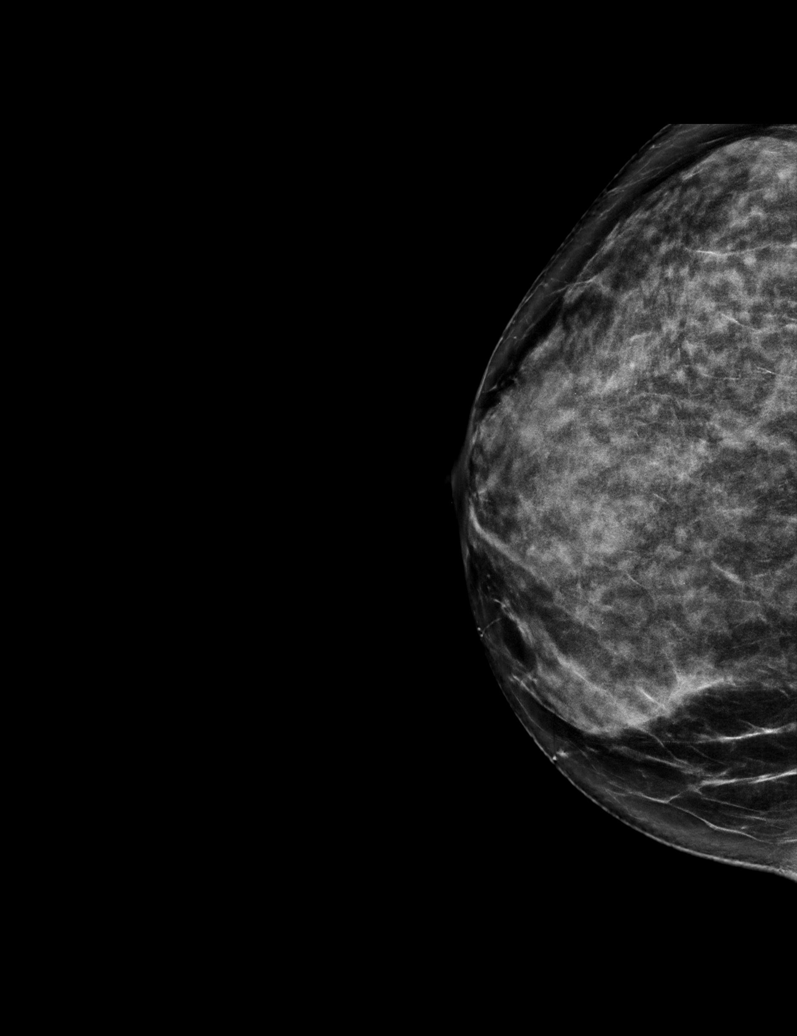

[L CC tomo · tomo slice 36/71.0]
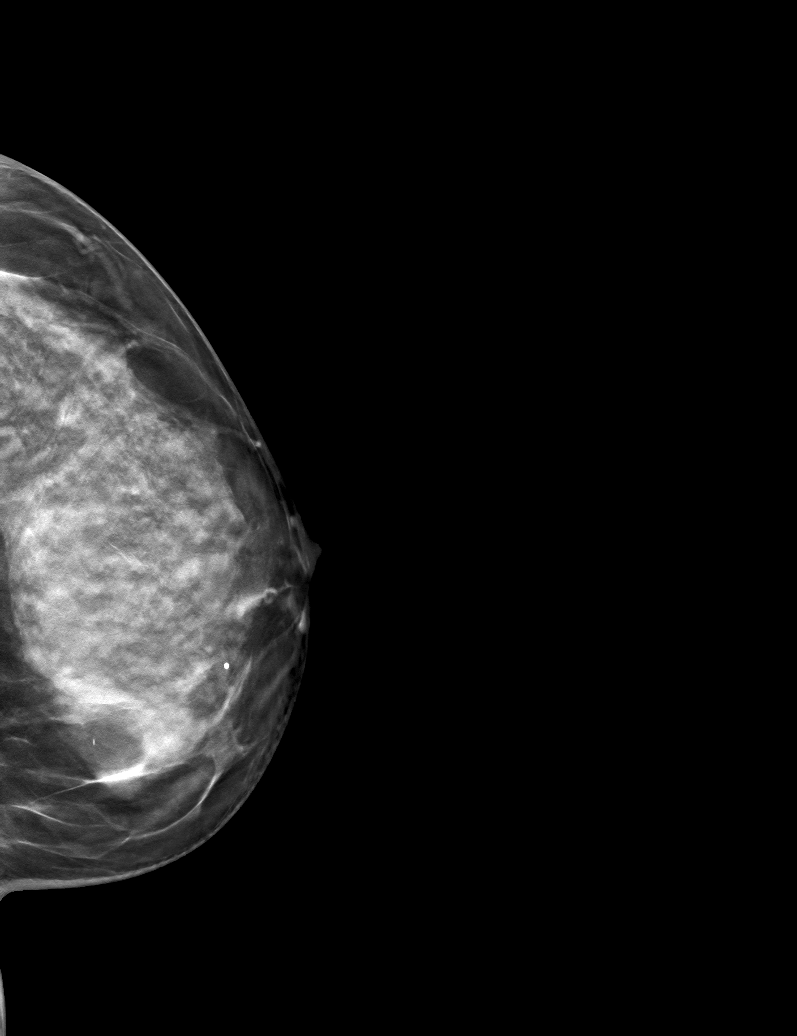

[6 of 30 positions shown; findings below may reference images not displayed]

ACR Breast Density Category d: The breast tissue is extremely dense,
which lowers the sensitivity of mammography.
FINDINGS: No suspicious calcifications, masses or areas of distortion are seen
in the bilateral breasts.

No discrete palpable masses are identified in the lateral aspect of
the left breast on clinical exam.

Ultrasound targeted to the upper-outer and lower-outer quadrants of
the left breast demonstrates normal fibroglandular tissue. No
suspicious masses or areas of shadowing are identified.
IMPRESSION: 1. There are no which is mammographic or or targeted sonographic
abnormalities in the lateral aspect of the left breast to explain
the patient's left breast pain, or the questioned palpable lump
identified on clinical breast exam.

2.  No mammographic evidence of malignancy in the bilateral breasts.

RECOMMENDATION:
1. Clinical follow-up recommended for the palpable area of concern
in the lateral left breast and the patient's lateral left breast
pain. Any further workup should be based on clinical grounds.

2. Screening mammogram at age 38 unless there are persistent or
intervening clinical concerns. (Code:OT-E-PJS)

I have discussed the findings and recommendations with the patient.
If applicable, a reminder letter will be sent to the patient
regarding the next appointment.

BI-RADS CATEGORY  1: Negative.

## 2022-12-11 ENCOUNTER — Other Ambulatory Visit: Payer: Self-pay | Admitting: Family Medicine

## 2022-12-11 ENCOUNTER — Other Ambulatory Visit (HOSPITAL_COMMUNITY)
Admission: RE | Admit: 2022-12-11 | Discharge: 2022-12-11 | Disposition: A | Discharge status: Home / Self Care | Payer: Managed Care, Other (non HMO) | Source: Ambulatory Visit | Attending: Family Medicine | Admitting: Family Medicine

## 2022-12-11 DIAGNOSIS — Z01411 Encounter for gynecological examination (general) (routine) with abnormal findings: Secondary | ICD-10-CM | POA: Insufficient documentation

## 2022-12-14 LAB — CYTOLOGY - PAP
Comment: NEGATIVE
Comment: NEGATIVE
Diagnosis: NEGATIVE
High risk HPV: NEGATIVE
Trichomonas: NEGATIVE
